# Patient Record
Sex: Female | Born: 1950 | Race: White | Hispanic: No | Smoking: Never smoker
Health system: Southern US, Community
[De-identification: ages and names within clinical notes are randomized; demographics above are authoritative.]

## PROBLEM LIST (undated history)

## (undated) DIAGNOSIS — R7303 Prediabetes: Secondary | ICD-10-CM

## (undated) DIAGNOSIS — E079 Disorder of thyroid, unspecified: Secondary | ICD-10-CM

## (undated) DIAGNOSIS — I10 Essential (primary) hypertension: Secondary | ICD-10-CM

## (undated) DIAGNOSIS — M1990 Unspecified osteoarthritis, unspecified site: Secondary | ICD-10-CM

## (undated) DIAGNOSIS — E785 Hyperlipidemia, unspecified: Secondary | ICD-10-CM

## (undated) DIAGNOSIS — F419 Anxiety disorder, unspecified: Secondary | ICD-10-CM

## (undated) DIAGNOSIS — H269 Unspecified cataract: Secondary | ICD-10-CM

## (undated) DIAGNOSIS — T7840XA Allergy, unspecified, initial encounter: Secondary | ICD-10-CM

## (undated) DIAGNOSIS — G4733 Obstructive sleep apnea (adult) (pediatric): Secondary | ICD-10-CM

## (undated) DIAGNOSIS — K219 Gastro-esophageal reflux disease without esophagitis: Secondary | ICD-10-CM

## (undated) DIAGNOSIS — G4730 Sleep apnea, unspecified: Secondary | ICD-10-CM

## (undated) DIAGNOSIS — K279 Peptic ulcer, site unspecified, unspecified as acute or chronic, without hemorrhage or perforation: Secondary | ICD-10-CM

## (undated) DIAGNOSIS — H409 Unspecified glaucoma: Secondary | ICD-10-CM

## (undated) HISTORY — DX: Allergy, unspecified, initial encounter: T78.40XA

## (undated) HISTORY — PX: POLYPECTOMY: SHX149

## (undated) HISTORY — DX: Unspecified cataract: H26.9

## (undated) HISTORY — DX: Hyperlipidemia, unspecified: E78.5

## (undated) HISTORY — DX: Sleep apnea, unspecified: G47.30

## (undated) HISTORY — DX: Peptic ulcer, site unspecified, unspecified as acute or chronic, without hemorrhage or perforation: K27.9

## (undated) HISTORY — PX: HEMORRHOID BANDING: SHX5850

## (undated) HISTORY — DX: Prediabetes: R73.03

## (undated) HISTORY — DX: Essential (primary) hypertension: I10

## (undated) HISTORY — PX: DILATION AND CURETTAGE OF UTERUS: SHX78

## (undated) HISTORY — DX: Obstructive sleep apnea (adult) (pediatric): G47.33

## (undated) HISTORY — PX: COLONOSCOPY: SHX174

## (undated) HISTORY — PX: BREAST EXCISIONAL BIOPSY: SUR124

## (undated) HISTORY — DX: Unspecified glaucoma: H40.9

## (undated) HISTORY — DX: Unspecified osteoarthritis, unspecified site: M19.90

## (undated) HISTORY — DX: Anxiety disorder, unspecified: F41.9

---

## 1984-02-24 HISTORY — PX: BREAST BIOPSY: SHX20

## 1997-06-06 ENCOUNTER — Other Ambulatory Visit: Admission: RE | Admit: 1997-06-06 | Discharge: 1997-06-06 | Payer: Self-pay | Admitting: *Deleted

## 1997-06-28 ENCOUNTER — Ambulatory Visit: Admission: RE | Admit: 1997-06-28 | Discharge: 1997-06-28 | Payer: Self-pay | Admitting: *Deleted

## 1998-06-13 ENCOUNTER — Other Ambulatory Visit: Admission: RE | Admit: 1998-06-13 | Discharge: 1998-06-13 | Payer: Self-pay | Admitting: *Deleted

## 1998-07-04 ENCOUNTER — Ambulatory Visit (HOSPITAL_COMMUNITY): Admission: RE | Admit: 1998-07-04 | Discharge: 1998-07-04 | Payer: Self-pay | Admitting: *Deleted

## 1998-07-04 ENCOUNTER — Encounter: Payer: Self-pay | Admitting: *Deleted

## 1999-10-13 ENCOUNTER — Encounter: Payer: Self-pay | Admitting: Emergency Medicine

## 1999-10-13 ENCOUNTER — Emergency Department (HOSPITAL_COMMUNITY): Admission: EM | Admit: 1999-10-13 | Discharge: 1999-10-13 | Payer: Self-pay

## 1999-12-26 ENCOUNTER — Other Ambulatory Visit: Admission: RE | Admit: 1999-12-26 | Discharge: 1999-12-26 | Payer: Self-pay | Admitting: Family Medicine

## 2000-01-08 ENCOUNTER — Encounter: Payer: Self-pay | Admitting: Family Medicine

## 2000-01-08 ENCOUNTER — Ambulatory Visit (HOSPITAL_COMMUNITY): Admission: RE | Admit: 2000-01-08 | Discharge: 2000-01-08 | Payer: Self-pay | Admitting: Family Medicine

## 2001-01-10 ENCOUNTER — Encounter: Payer: Self-pay | Admitting: Family Medicine

## 2001-01-10 ENCOUNTER — Ambulatory Visit (HOSPITAL_COMMUNITY): Admission: RE | Admit: 2001-01-10 | Discharge: 2001-01-10 | Payer: Self-pay | Admitting: Family Medicine

## 2001-02-19 ENCOUNTER — Encounter: Payer: Self-pay | Admitting: *Deleted

## 2001-02-19 ENCOUNTER — Ambulatory Visit (HOSPITAL_COMMUNITY): Admission: RE | Admit: 2001-02-19 | Discharge: 2001-02-19 | Payer: Self-pay | Admitting: *Deleted

## 2001-05-23 ENCOUNTER — Other Ambulatory Visit: Admission: RE | Admit: 2001-05-23 | Discharge: 2001-05-23 | Payer: Self-pay | Admitting: Family Medicine

## 2003-03-28 ENCOUNTER — Ambulatory Visit (HOSPITAL_COMMUNITY): Admission: RE | Admit: 2003-03-28 | Discharge: 2003-03-28 | Payer: Self-pay | Admitting: Family Medicine

## 2003-04-26 ENCOUNTER — Other Ambulatory Visit: Admission: RE | Admit: 2003-04-26 | Discharge: 2003-04-26 | Payer: Self-pay | Admitting: Family Medicine

## 2004-01-02 ENCOUNTER — Ambulatory Visit (HOSPITAL_COMMUNITY): Admission: RE | Admit: 2004-01-02 | Discharge: 2004-01-02 | Payer: Self-pay | Admitting: Obstetrics & Gynecology

## 2004-01-02 ENCOUNTER — Encounter (INDEPENDENT_AMBULATORY_CARE_PROVIDER_SITE_OTHER): Payer: Self-pay | Admitting: Specialist

## 2004-04-04 ENCOUNTER — Ambulatory Visit (HOSPITAL_COMMUNITY): Admission: RE | Admit: 2004-04-04 | Discharge: 2004-04-04 | Payer: Self-pay | Admitting: Family Medicine

## 2005-01-28 ENCOUNTER — Emergency Department (HOSPITAL_COMMUNITY): Admission: EM | Admit: 2005-01-28 | Discharge: 2005-01-28 | Payer: Self-pay | Admitting: Emergency Medicine

## 2005-06-12 ENCOUNTER — Ambulatory Visit (HOSPITAL_COMMUNITY): Admission: RE | Admit: 2005-06-12 | Discharge: 2005-06-12 | Payer: Self-pay | Admitting: Family Medicine

## 2006-01-28 ENCOUNTER — Other Ambulatory Visit: Admission: RE | Admit: 2006-01-28 | Discharge: 2006-01-28 | Payer: Self-pay | Admitting: Family Medicine

## 2006-06-17 ENCOUNTER — Ambulatory Visit (HOSPITAL_COMMUNITY): Admission: RE | Admit: 2006-06-17 | Discharge: 2006-06-17 | Payer: Self-pay | Admitting: Family Medicine

## 2007-07-05 ENCOUNTER — Ambulatory Visit (HOSPITAL_COMMUNITY): Admission: RE | Admit: 2007-07-05 | Discharge: 2007-07-05 | Payer: Self-pay | Admitting: Obstetrics & Gynecology

## 2008-07-20 ENCOUNTER — Encounter: Admission: RE | Admit: 2008-07-20 | Discharge: 2008-07-20 | Payer: Self-pay | Admitting: Family Medicine

## 2008-08-09 ENCOUNTER — Encounter: Admission: RE | Admit: 2008-08-09 | Discharge: 2008-08-09 | Payer: Self-pay | Admitting: Family Medicine

## 2008-08-13 ENCOUNTER — Ambulatory Visit (HOSPITAL_COMMUNITY): Admission: RE | Admit: 2008-08-13 | Discharge: 2008-08-13 | Payer: Self-pay | Admitting: Family Medicine

## 2008-08-17 ENCOUNTER — Encounter: Admission: RE | Admit: 2008-08-17 | Discharge: 2008-08-17 | Payer: Self-pay | Admitting: Family Medicine

## 2008-10-12 ENCOUNTER — Encounter (INDEPENDENT_AMBULATORY_CARE_PROVIDER_SITE_OTHER): Payer: Self-pay | Admitting: Obstetrics and Gynecology

## 2008-10-12 ENCOUNTER — Ambulatory Visit (HOSPITAL_COMMUNITY): Admission: RE | Admit: 2008-10-12 | Discharge: 2008-10-12 | Payer: Self-pay | Admitting: Obstetrics and Gynecology

## 2009-08-16 ENCOUNTER — Ambulatory Visit (HOSPITAL_COMMUNITY): Admission: RE | Admit: 2009-08-16 | Discharge: 2009-08-16 | Payer: Self-pay | Admitting: Family Medicine

## 2010-05-31 LAB — BASIC METABOLIC PANEL
CO2: 27 mEq/L (ref 19–32)
Calcium: 9.3 mg/dL (ref 8.4–10.5)
Chloride: 105 mEq/L (ref 96–112)
GFR calc Af Amer: >60 mL/min (ref >60)
Sodium: 140 mEq/L (ref 135–145)

## 2010-05-31 LAB — CBC
Hemoglobin: 14.4 g/dL (ref 12.0–15.0)
MCHC: 34.1 g/dL (ref 30.0–36.0)
RBC: 4.57 MIL/uL (ref 3.87–5.11)
WBC: 6.2 10*3/uL (ref 4.0–10.5)

## 2010-07-08 NOTE — Op Note (Signed)
NAMECLARY, BOULAIS NO.:  [id number]   MEDICAL RECORD NO.:  [id number]          PATIENT TYPE:  AMB   LOCATION:  SDC                           FACILITY:  [facility]   PHYSICIAN:  Miguel Aschoff, M.D.       DATE OF BIRTH:  April 16, 1950   DATE OF PROCEDURE:  10/12/2008  DATE OF DISCHARGE:                               OPERATIVE REPORT   PREOPERATIVE DIAGNOSES:  Postmenopausal bleeding and endometrial polyp.   POSTOPERATIVE DIAGNOSES:  Postmenopausal bleeding and endometrial polyp.   PROCEDURES:  Cervical dilatation, hysteroscopy, removal of endometrial  polyp, and uterine curettage.   SURGEON:  Miguel Aschoff, MD   ANESTHESIA:  General.   COMPLICATIONS:  None.   JUSTIFICATION:  The patient is a 61 year old white female, who had  postmenopausal bleeding and on vaginal ultrasound was noted to have a  polypoid mass within the fundus of the uterus.  Because of this bleeding  and the endometrial polyp, she presents now to undergo hysteroscopy, D  and C, and polypectomy to ensure that a endometrial neoplasm does not  exist.  The risks and benefits of procedure were discussed with the  patient.  Informed consent has been obtained.   PROCEDURE:  The patient was taken to the operating room, placed in  supine position.  General anesthesia was then administered without  difficulty.  She was then placed in dorsal lithotomy position, prepped  and draped in the usual sterile fashion.  Bladder was catheterized.  Examination under anesthesia revealed normal external genitalia, normal  Bartholin and Skene glands, normal urethra.  The vaginal vault was  without gross lesion.  The cervix was without gross lesion.  Uterus was  noted to be normal size and shape.  No adnexal masses were noted.  At  this point, a speculum was placed in the vaginal vault.  Anterior  cervical lip was grasped with a tenaculum and dilated using serial Pratt  dilators until a #25 Pratt dilator could be passed.  At  this point, the  diagnostic hysteroscope was advanced through the endocervical canal.  No  endocervical lesions were noted and on entering the endometrial cavity,  large polypoid mass was noted arising from the right wall.  The fundus  filling two-thirds of the endometrial cavity and no other lesions were  noted.  At this point, hysteroscope was removed.  Polyp forceps were  introduced and polyp was removed with polyp forceps and sent for  histologic study.  The confirmation of polypectomy was then carried out  by replacing the hysteroscope to ensure that it had been totally  removed.  Once this was confirmed, sharp curettage of the uterine cavity  was carried out and this was sent as a separate specimen.  On completion  of the curettage, the procedure was completed.  The cervix was injected  with 20 mL of 1% Xylocaine by placing equal amounts at the 12, 4, and 8  o'clock positions on the cervix.  All instruments were removed.  Hemostasis was readily achieved.  The patient was brought to recovery  room in satisfactory condition.  Estimated blood  loss was 20 mL.   Plan is for the patient to be discharged home.  Medications for home  include Darvocet-N 100 every 4 hours needed for pain, doxycycline 1  twice a day x3 days.  The patient will be seen back in 4 weeks for  followup examination.  She is to call for any problems such as fever,  pain, or heavy bleeding.  She is to call for pathology report in 1 week.      Miguel Aschoff, M.D.  Electronically Signed     AR/MEDQ  D:  10/12/2008  T:  10/12/2008  Job:  [id number]

## 2010-07-11 NOTE — H&P (Signed)
Beth Sawyer, TABAK              ACCOUNT NO.:  [id number]   MEDICAL RECORD NO.:  [id number]          PATIENT TYPE:  AMB   LOCATION:  SDC                           [facility]   PHYSICIAN:  Roseanna Rainbow, M.D.DATE OF BIRTH:  1950-12-31   DATE OF ADMISSION:  DATE OF DISCHARGE:                                HISTORY & PHYSICAL   CHIEF COMPLAINT:  The patient is a 60 year old with an endometrial polyp for  D&C hysteroscopy.   HISTORY OF PRESENT ILLNESS:  As above.  The patient reports no menses for 2  years.  Subsequently, she has had several episodes of bleeding.  Work-up to  date has included a pelvic ultrasound on October 25, 2003 that demonstrated  an endometrial stripe of 1.1 cm.  An endometrial biopsy performed in the  office demonstrated a hyperplastic type endometrial polyp with scant  fragments of atrophic type endometrium.  The patient is not currently on any  hormone replacement therapy.  She denies any concomitant symptoms.  She also  has a history of endometrial polyps, and is status post a D&C several years  ago.   PAST MEDICAL HISTORY:  1.  Hypercholesterolemia.  2.  Hypothyroidism.  3.  Hypertension.  4.  Urinary tract infections.  5.  Diverticulosis.  6.  Familial tremor.   PAST SURGICAL HISTORY:  1.  See above.  2.  She has a history of a breast biopsy.   SOCIAL HISTORY:  She is a widow.  She has no significant smoking history.  She denies alcohol usage.  There is no recreational drug use.   FAMILY HISTORY:  Positive for adult onset diabetes, ischemic heart disease,  breast cancer, and cervical cancer.   PAST OBSTETRICAL HISTORY:  She has 3 living children.  She is status post 3  cesarean deliveries.   PAST GYNECOLOGIC HISTORY:  See above.  She is not sexually active.  She has  a history of a normal Pap smear in March of 2005.  Last mammogram was  performed in February of 2005.   REVIEW OF SYSTEMS:  GU:  See history of present illness.  She  reports pain  with intercourse.   MEDICATIONS:  1.  Levoxyl 50 mcg tablets, one tablet p.o. daily.  2.  Nasonex.  3.  Toprol-XL 50 mg p.o. daily.   ALLERGIES:  CODEINE.   PHYSICAL EXAMINATION:  VITAL SIGNS:  Stable, afebrile.  GENERAL:  Well-developed, well-nourished, in no apparent distress.  NECK:  Supple.  LUNGS:  Clear to auscultation bilaterally.  HEART:  Regular rate and rhythm.  ABDOMEN:  Soft, nontender, without masses.  PELVIC:  EGBUS normal.  On speculum exam, the vagina is clean.  The cervix  is without visible lesions.  On bimanual exam, the uterus is mildly  enlarged, anteverted, nontender.  The adnexa are nonpalpable, nontender.   ASSESSMENT AND PLAN:  Postmenopausal bleeding with an endometrial polyp.   PLAN:  The planned procedure is a D&C hysteroscopy, possible operative  hysteroscopy, with removal of an endometrial polyp.  The risks, benefits,  and alternative forms of management were reviewed with  the patient, and  informed consent was obtained.     Collier Flowers  D:  01/01/2004  T:  01/01/2004  Job:  [id number]   cc:   Magnus Sinning. Dimple Casey, M.D.  [address] [place]  [place] [address]  Fax: [phone]

## 2010-07-11 NOTE — Op Note (Signed)
Beth Sawyer, Beth Sawyer              ACCOUNT NO.:  [id number]   MEDICAL RECORD NO.:  [id number]          PATIENT TYPE:  AMB   LOCATION:  SDC                           [facility]   PHYSICIAN:  Roseanna Rainbow, M.D.DATE OF BIRTH:  Oct 05, 1950   DATE OF PROCEDURE:  01/02/2004  DATE OF DISCHARGE:                                 OPERATIVE REPORT   PREOPERATIVE DIAGNOSES:  Postmenopausal bleeding, rule out endometrial  polyp.   POSTOPERATIVE DIAGNOSES:  Postmenopausal bleeding, rule out endometrial  polyp.   PROCEDURE:  Diagnostic hysteroscopy with dilatation and curettage and  removal of endometrial polyps.   SURGEON:  Roseanna Rainbow, M.D.   ANESTHESIA:  General endotracheal.   COMPLICATIONS:  None.   ESTIMATED BLOOD LOSS:  Less than 50 mL.   FINDINGS:  There were 2 polypoid structures less than 1 cm in diameter  arising from the posterior wall of the uterine fundus.  The remainder of the  endometrial lining appeared atrophic.   DESCRIPTION OF PROCEDURE:  The patient was taken to the operating room.  A  weighted speculum was placed in the patient's vagina, the cervix and vagina  were swabbed with Betadine. The single tooth tenaculum was applied to the  anterior lip of the cervix.  The cervix was then dilated with [facility]  dilators. A diagnostic hysteroscopy was then performed using glycine as a  distending medium. The above findings were noted. The hysteroscope was then  removed. A sharp curettage was performed. Polypoid structures were then  removed using polyp forceps. The hysteroscope was then reintroduced into the  uterus and findings were consistent with removal of the polyps.  The single  tooth tenaculum was removed and there was minimal bleeding noted from the  cervix. The patient tolerated the procedure well. The patient was taken to  the PACU in stable condition.   PATHOLOGY:  Endometrial curettings and endometrial polyps.     Collier Flowers  D:   01/02/2004  T:  01/02/2004  Job:  [id number]

## 2010-07-19 ENCOUNTER — Encounter: Payer: Self-pay | Admitting: Gastroenterology

## 2010-07-29 ENCOUNTER — Other Ambulatory Visit (HOSPITAL_COMMUNITY): Payer: Self-pay | Admitting: Nurse Practitioner

## 2010-09-04 ENCOUNTER — Other Ambulatory Visit (HOSPITAL_COMMUNITY): Payer: Self-pay | Admitting: Bariatrics

## 2010-09-04 DIAGNOSIS — Z1231 Encounter for screening mammogram for malignant neoplasm of breast: Secondary | ICD-10-CM

## 2010-09-18 ENCOUNTER — Ambulatory Visit (HOSPITAL_COMMUNITY): Payer: Self-pay

## 2010-09-24 ENCOUNTER — Ambulatory Visit (HOSPITAL_COMMUNITY)
Admission: RE | Admit: 2010-09-24 | Discharge: 2010-09-24 | Disposition: A | Discharge status: Home / Self Care | Payer: Self-pay | Source: Ambulatory Visit | Attending: Bariatrics | Admitting: Bariatrics

## 2010-09-24 DIAGNOSIS — Z1231 Encounter for screening mammogram for malignant neoplasm of breast: Secondary | ICD-10-CM

## 2010-09-26 ENCOUNTER — Other Ambulatory Visit: Payer: Self-pay | Admitting: Bariatrics

## 2010-09-26 ENCOUNTER — Other Ambulatory Visit: Payer: Self-pay | Admitting: Unknown Physician Specialty

## 2010-09-26 DIAGNOSIS — R928 Other abnormal and inconclusive findings on diagnostic imaging of breast: Secondary | ICD-10-CM

## 2010-10-03 ENCOUNTER — Ambulatory Visit
Admission: RE | Admit: 2010-10-03 | Discharge: 2010-10-03 | Disposition: A | Discharge status: Home / Self Care | Payer: Self-pay | Source: Ambulatory Visit | Attending: Bariatrics | Admitting: Bariatrics

## 2010-10-03 DIAGNOSIS — R928 Other abnormal and inconclusive findings on diagnostic imaging of breast: Secondary | ICD-10-CM

## 2011-04-16 ENCOUNTER — Emergency Department (HOSPITAL_COMMUNITY): Payer: Self-pay

## 2011-04-16 ENCOUNTER — Other Ambulatory Visit: Payer: Self-pay

## 2011-04-16 ENCOUNTER — Emergency Department (HOSPITAL_COMMUNITY)
Admission: EM | Admit: 2011-04-16 | Discharge: 2011-04-16 | Disposition: A | Discharge status: Home / Self Care | Payer: Self-pay | Attending: Emergency Medicine | Admitting: Emergency Medicine

## 2011-04-16 ENCOUNTER — Encounter (HOSPITAL_COMMUNITY): Payer: Self-pay

## 2011-04-16 DIAGNOSIS — E079 Disorder of thyroid, unspecified: Secondary | ICD-10-CM | POA: Insufficient documentation

## 2011-04-16 DIAGNOSIS — R071 Chest pain on breathing: Secondary | ICD-10-CM | POA: Insufficient documentation

## 2011-04-16 DIAGNOSIS — R079 Chest pain, unspecified: Secondary | ICD-10-CM | POA: Insufficient documentation

## 2011-04-16 DIAGNOSIS — Z79899 Other long term (current) drug therapy: Secondary | ICD-10-CM | POA: Insufficient documentation

## 2011-04-16 DIAGNOSIS — R0789 Other chest pain: Secondary | ICD-10-CM

## 2011-04-16 DIAGNOSIS — I10 Essential (primary) hypertension: Secondary | ICD-10-CM | POA: Insufficient documentation

## 2011-04-16 HISTORY — DX: Essential (primary) hypertension: I10

## 2011-04-16 HISTORY — DX: Disorder of thyroid, unspecified: E07.9

## 2011-04-16 LAB — POCT I-STAT TROPONIN I: Troponin i, poc: 0.04 ng/mL (ref 0.00–0.08)

## 2011-04-16 LAB — BASIC METABOLIC PANEL
CO2: 27 mEq/L (ref 19–32)
Calcium: 10.1 mg/dL (ref 8.4–10.5)
GFR calc non Af Amer: >90 mL/min (ref >90)
Sodium: 142 mEq/L (ref 135–145)

## 2011-04-16 LAB — DIFFERENTIAL
Basophils Absolute: 0 10*3/uL (ref 0.0–0.1)
Eosinophils Absolute: 1.2 10*3/uL — ABNORMAL HIGH (ref 0.0–0.7)
Eosinophils Relative: 15 % — ABNORMAL HIGH (ref 0–5)
Lymphocytes Relative: 27 % (ref 12–46)

## 2011-04-16 LAB — CBC
MCV: 91.6 fL (ref 78.0–100.0)
Platelets: 307 10*3/uL (ref 150–400)
RDW: 12.8 % (ref 11.5–15.5)
WBC: 8.3 10*3/uL (ref 4.0–10.5)

## 2011-04-16 LAB — D-DIMER, QUANTITATIVE: D-Dimer, Quant: 0.52 ug/mL-FEU — ABNORMAL HIGH (ref 0.00–0.48)

## 2011-04-16 MED ORDER — ASPIRIN 81 MG PO CHEW
324.0000 mg | CHEWABLE_TABLET | Freq: Once | ORAL | Status: AC
  Administered 2011-04-16: 324 mg via ORAL
  Filled 2011-04-16: qty 4

## 2011-04-16 MED ORDER — IOHEXOL 350 MG/ML SOLN
100.0000 mL | Freq: Once | INTRAVENOUS | Status: AC | PRN
  Administered 2011-04-16: 100 mL via INTRAVENOUS

## 2011-04-16 NOTE — ED Notes (Signed)
Awaiting ct angio  

## 2011-04-16 NOTE — ED Provider Notes (Signed)
Assumed care from [facility].  Patient with difficult IV access.  18G angio to L forearm placed by resident Dr. Lendell Caprice. CTPE neg.  F'u with Dr. Garner Nash for atypical chest pain.  Angiocath insertion Performed by: Glynn Octave  Consent: Verbal consent obtained. Risks and benefits: risks, benefits and alternatives were discussed Time out: Immediately prior to procedure a "time out" was called to verify the correct patient, procedure, equipment, support staff and site/side marked as required.  Preparation: Patient was prepped and draped in the usual sterile fashion.  Vein Location: L AC  Yes Ultrasound Guided  Gauge: 18  Normal blood return and flush without difficulty Patient tolerance: Patient tolerated the procedure well with no immediate complications.     Glynn Octave, MD 04/16/11 [phone]

## 2011-04-16 NOTE — ED Notes (Signed)
Returned from ct 

## 2011-04-16 NOTE — ED Provider Notes (Signed)
History     CSN: [id number]  Arrival date & time 04/16/11  1223   First MD Initiated Contact with Patient 04/16/11 1424      Chief Complaint  Patient presents with  . Chest Pain    right sided    (Consider location/radiation/quality/duration/timing/severity/associated sxs/prior treatment) HPI  Patient presents to emergency department complaining of a two-week history of right-sided chest discomfort that is episodic throughout the day every day for 2 weeks. Patient states when she has the discomfort in the right side of her chest it feels like acute onset of sharp pain in chest that will quickly turn "sore" with a radiation of "heaviness into her right arm." patient states pain lasts a few minutes. Patient has taken no medication prior to arrival for the symptoms. She denies daily aspirin use. She states she has high blood pressure and is hypothyroid for which she takes medication. Patient states that approximately 2 weeks ago she had temporarily stopped her Prevacid but when she began to have the discomfort in her chest began to Prevacid once again. Denies any other changes in her daily medication use. Patient denies tobacco use, alcohol use, or illicit drug use. Patient states pain is mildly aggravated by swelling her chest, deep inhalation, but states that if she bends forward then she knows she "will get sharp pain into chest." She denies alleviating factors. She denies associated fevers, chills, shortness of breath, diaphoresis, nausea, vomiting, abdominal pain, cough, hemoptysis, lower extremity pain or swelling, history of DVT or PE, or exogenous estrogen use.  Past Medical History  Diagnosis Date  . Thyroid disease   . Hypertension     History reviewed. No pertinent past surgical history.  History reviewed. No pertinent family history.  History  Substance Use Topics  . Smoking status: Never Smoker   . Smokeless tobacco: Not on file  . Alcohol Use: No    OB History    Grav  Para Term Preterm Abortions TAB SAB Ect Mult Living                  Review of Systems  All other systems reviewed and are negative.    Allergies  Codeine  Home Medications   Current Outpatient Rx  Name Route Sig Dispense Refill  . OMEGA-3 FATTY ACIDS 1000 MG PO CAPS Oral Take 1 g by mouth daily.    Marland Kitchen LANSOPRAZOLE 15 MG PO CPDR Oral Take 15 mg by mouth daily.    Marland Kitchen LEVOTHYROXINE SODIUM 75 MCG PO TABS Oral Take 75 mcg by mouth daily.    Marland Kitchen METOPROLOL SUCCINATE ER 50 MG PO TB24 Oral Take 50 mg by mouth daily. Take with or immediately following a meal.    . ADULT MULTIVITAMIN W/MINERALS CH Oral Take 1 tablet by mouth daily.    Marland Kitchen TIMOLOL MALEATE 0.5 % OP SOLN Both Eyes Place 1 drop into both eyes 2 (two) times daily.      BP 152/78  Pulse 81  Temp(Src) 98.2 F (36.8 C) (Oral)  Resp 20  SpO2 97%  Physical Exam  Nursing note and vitals reviewed. Constitutional: She is oriented to person, place, and time. She appears well-developed and well-nourished. No distress.  HENT:  Head: Normocephalic and atraumatic.  Eyes: Conjunctivae are normal. Pupils are equal, round, and reactive to light.  Neck: Normal range of motion. Neck supple. No thyromegaly present.  Cardiovascular: Normal rate, regular rhythm, normal heart sounds and intact distal pulses.  Exam reveals no gallop and no  friction rub.   No murmur heard. Pulmonary/Chest: Effort normal and breath sounds normal. No respiratory distress. She has no wheezes. She has no rales. She exhibits tenderness.       Mild tenderness to palpation of right anterior chest wall but without skin changes, rash, or crepitus.   Abdominal: Bowel sounds are normal. She exhibits no distension and no mass. There is no tenderness. There is no rebound and no guarding.  Musculoskeletal: Normal range of motion. She exhibits no edema and no tenderness.       Full range of motion of bilateral upper extremities without pain or difficulty.  Neurological: She is  alert and oriented to person, place, and time.  Skin: Skin is warm and dry. No rash noted. She is not diaphoretic. No erythema.  Psychiatric: She has a normal mood and affect.    ED Course  Procedures (including critical care time)  By mouth aspirin   Date: 04/16/2011  Rate: 76  Rhythm: normal sinus rhythm  QRS Axis: normal  Intervals: normal  ST/T Wave abnormalities: normal, non specific t wave changes  Conduction Disutrbances: none  Narrative Interpretation: non provocative  Old EKG Reviewed: no prior EKG      Labs Reviewed  CBC  DIFFERENTIAL  BASIC METABOLIC PANEL  D-DIMER, QUANTITATIVE   Dg Chest 2 View  04/16/2011  *RADIOLOGY REPORT*  Clinical Data: Right chest pain  CHEST - 2 VIEW  Comparison: None.  Findings: The cardiac silhouette, mediastinum, pulmonary vasculature are within normal limits.  Both lungs are clear. There is no acute bony abnormality.  IMPRESSION: There is no evidence of acute cardiac or pulmonary process.  Original Report Authenticated By: Brandon Melnick, M.D.     No diagnosis found.    MDM  Dr. Manus Gunning to follow patient with BMET and CT angio chest pending to r/o PE with mild elevation in ddimer. VSS.         Jenness Corner, [place] 04/16/11 1544

## 2011-04-16 NOTE — ED Notes (Signed)
Pt states that 2 weeks ago she had onset of right sided chest pain with some radiation into her right arm. She denies any other s/s. Pt denies any trauma or falls. No meds pta. Alert and oriented.

## 2011-04-16 NOTE — ED Notes (Signed)
2 RNs attempted IV access, unsuccessful; paged IV team

## 2011-04-16 NOTE — ED Notes (Signed)
Ed resident at bedside to place IV via ultrasound.

## 2011-04-16 NOTE — ED Provider Notes (Signed)
Patient states over the last couple weeks in 2-3 times a day she was a sudden onset of sharp stabbing right-sided chest discomfort that gradually goes away it resolves in several minutes which is nonexertional and nonpleuritic but is somewhat positional because it recurs if she bends over he leans forward to tie her shoes.  Medical screening examination/treatment/procedure(s) were conducted as a shared visit with non-physician practitioner(s) and myself.  I personally evaluated the patient during the encounter  Hurman Horn, MD 04/16/11 1902

## 2011-04-16 NOTE — Discharge Instructions (Signed)
There is no evidence of a heart attack or blood clot in her lung. Followup with Dr. Garner Nash for a stress test. Return to the ED if there are worsening symptoms.  Chest Pain (Nonspecific) It is often hard to give a specific diagnosis for the cause of chest pain. There is always a chance that your pain could be related to something serious, such as a heart attack or a blood clot in the lungs. You need to follow up with your caregiver for further evaluation. CAUSES   Heartburn.   Pneumonia or bronchitis.   Anxiety and stress.   Inflammation around your heart (pericarditis) or lung (pleuritis or pleurisy).   A blood clot in the lung.   A collapsed lung (pneumothorax). It can develop suddenly on its own (spontaneous pneumothorax) or from injury (trauma) to the chest.  The chest wall is composed of bones, muscles, and cartilage. Any of these can be the source of the pain.  The bones can be bruised by injury.   The muscles or cartilage can be strained by coughing or overwork.   The cartilage can be affected by inflammation and become sore (costochondritis).  DIAGNOSIS  Lab tests or other studies, such as X-rays, an EKG, stress testing, or cardiac imaging, may be needed to find the cause of your pain.  TREATMENT   Treatment depends on what may be causing your chest pain. Treatment may include:   Acid blockers for heartburn.   Anti-inflammatory medicine.   Pain medicine for inflammatory conditions.   Antibiotics if an infection is present.   You may be advised to change lifestyle habits. This includes stopping smoking and avoiding caffeine and chocolate.   You may be advised to keep your head raised (elevated) when sleeping. This reduces the chance of acid going backward from your stomach into your esophagus.   Most of the time, nonspecific chest pain will improve within 2 to 3 days with rest and mild pain medicine.  HOME CARE INSTRUCTIONS   If antibiotics were prescribed, take  the full amount even if you start to feel better.   For the next few days, avoid physical activities that bring on chest pain. Continue physical activities as directed.   Do not smoke cigarettes or drink alcohol until your symptoms are gone.   Only take over-the-counter or prescription medicine for pain, discomfort, or fever as directed by your caregiver.   Follow your caregiver's suggestions for further testing if your chest pain does not go away.   Keep any follow-up appointments you made. If you do not go to an appointment, you could develop lasting (chronic) problems with pain. If there is any problem keeping an appointment, you must call to reschedule.  SEEK MEDICAL CARE IF:   You think you are having problems from the medicine you are taking. Read your medicine instructions carefully.   Your chest pain does not go away, even after treatment.   You develop a rash with blisters on your chest.  SEEK IMMEDIATE MEDICAL CARE IF:   You have increased chest pain or pain that spreads to your arm, neck, jaw, back, or belly (abdomen).   You develop shortness of breath, an increasing cough, or you are coughing up blood.   You have severe back or abdominal pain, feel sick to your stomach (nauseous) or throw up (vomit).   You develop severe weakness, fainting, or chills.   You have an oral temperature above 102 F (38.9 C), not controlled by medicine.  THIS  IS AN EMERGENCY. Do not wait to see if the pain will go away. Get medical help at once. Call your local emergency services (911 in [place] Do not drive yourself to the hospital. MAKE SURE YOU:   Understand these instructions.   Will watch your condition.   Will get help right away if you are not doing well or get worse.  Document Released: 11/19/2004 Document Revised: 10/22/2010 Document Reviewed: 09/15/2007 [facility] Patient Information 2012 [place], [place].Chest Pain (Nonspecific) It is often hard to give a specific diagnosis for  the cause of chest pain. There is always a chance that your pain could be related to something serious, such as a heart attack or a blood clot in the lungs. You need to follow up with your caregiver for further evaluation. CAUSES   Heartburn.   Pneumonia or bronchitis.   Anxiety and stress.   Inflammation around your heart (pericarditis) or lung (pleuritis or pleurisy).   A blood clot in the lung.   A collapsed lung (pneumothorax). It can develop suddenly on its own (spontaneous pneumothorax) or from injury (trauma) to the chest.  The chest wall is composed of bones, muscles, and cartilage. Any of these can be the source of the pain.  The bones can be bruised by injury.   The muscles or cartilage can be strained by coughing or overwork.   The cartilage can be affected by inflammation and become sore (costochondritis).  DIAGNOSIS  Lab tests or other studies, such as X-rays, an EKG, stress testing, or cardiac imaging, may be needed to find the cause of your pain.  TREATMENT   Treatment depends on what may be causing your chest pain. Treatment may include:   Acid blockers for heartburn.   Anti-inflammatory medicine.   Pain medicine for inflammatory conditions.   Antibiotics if an infection is present.   You may be advised to change lifestyle habits. This includes stopping smoking and avoiding caffeine and chocolate.   You may be advised to keep your head raised (elevated) when sleeping. This reduces the chance of acid going backward from your stomach into your esophagus.   Most of the time, nonspecific chest pain will improve within 2 to 3 days with rest and mild pain medicine.  HOME CARE INSTRUCTIONS   If antibiotics were prescribed, take the full amount even if you start to feel better.   For the next few days, avoid physical activities that bring on chest pain. Continue physical activities as directed.   Do not smoke cigarettes or drink alcohol until your symptoms are  gone.   Only take over-the-counter or prescription medicine for pain, discomfort, or fever as directed by your caregiver.   Follow your caregiver's suggestions for further testing if your chest pain does not go away.   Keep any follow-up appointments you made. If you do not go to an appointment, you could develop lasting (chronic) problems with pain. If there is any problem keeping an appointment, you must call to reschedule.  SEEK MEDICAL CARE IF:   You think you are having problems from the medicine you are taking. Read your medicine instructions carefully.   Your chest pain does not go away, even after treatment.   You develop a rash with blisters on your chest.  SEEK IMMEDIATE MEDICAL CARE IF:   You have increased chest pain or pain that spreads to your arm, neck, jaw, back, or belly (abdomen).   You develop shortness of breath, an increasing cough, or you are coughing  up blood.   You have severe back or abdominal pain, feel sick to your stomach (nauseous) or throw up (vomit).   You develop severe weakness, fainting, or chills.   You have an oral temperature above 102 F (38.9 C), not controlled by medicine.  THIS IS AN EMERGENCY. Do not wait to see if the pain will go away. Get medical help at once. Call your local emergency services (911 in [place] Do not drive yourself to the hospital. MAKE SURE YOU:   Understand these instructions.   Will watch your condition.   Will get help right away if you are not doing well or get worse.  Document Released: 11/19/2004 Document Revised: 10/22/2010 Document Reviewed: 09/15/2007 [facility] Patient Information 2012 [place], [place].

## 2011-04-16 NOTE — ED Notes (Signed)
Pt to ED for eval of CP X 2 weeks intermittently; pt reports that she went off her prevacid and that is when the pain started; pt reports that she restarted it 11 days ago, and thought that pain would go away but it has not; pain started to get worse today; pt describes it as pressure; pt reports that pain will radiate from R chest down R arm, and at times across to L chest; pt denies sob; pt does state that pain gets worse with deep breath; pt denies recent travel

## 2011-04-16 NOTE — ED Notes (Signed)
Patient transported to CT 

## 2011-05-07 ENCOUNTER — Other Ambulatory Visit (HOSPITAL_COMMUNITY): Payer: Self-pay | Admitting: Cardiology

## 2011-05-12 ENCOUNTER — Encounter (HOSPITAL_COMMUNITY)
Admission: RE | Admit: 2011-05-12 | Discharge: 2011-05-12 | Disposition: A | Discharge status: Home / Self Care | Payer: Self-pay | Source: Ambulatory Visit | Attending: Cardiology | Admitting: Cardiology

## 2011-05-12 ENCOUNTER — Ambulatory Visit (HOSPITAL_COMMUNITY)
Admission: RE | Admit: 2011-05-12 | Discharge: 2011-05-12 | Disposition: A | Discharge status: Home / Self Care | Payer: Self-pay | Source: Ambulatory Visit | Attending: Cardiology | Admitting: Cardiology

## 2011-05-12 DIAGNOSIS — I059 Rheumatic mitral valve disease, unspecified: Secondary | ICD-10-CM | POA: Insufficient documentation

## 2011-05-12 DIAGNOSIS — R0989 Other specified symptoms and signs involving the circulatory and respiratory systems: Secondary | ICD-10-CM | POA: Insufficient documentation

## 2011-05-12 DIAGNOSIS — R079 Chest pain, unspecified: Secondary | ICD-10-CM | POA: Insufficient documentation

## 2011-05-12 DIAGNOSIS — E039 Hypothyroidism, unspecified: Secondary | ICD-10-CM | POA: Insufficient documentation

## 2011-05-12 DIAGNOSIS — Z8249 Family history of ischemic heart disease and other diseases of the circulatory system: Secondary | ICD-10-CM | POA: Insufficient documentation

## 2011-05-12 DIAGNOSIS — I10 Essential (primary) hypertension: Secondary | ICD-10-CM | POA: Insufficient documentation

## 2011-05-12 DIAGNOSIS — R0602 Shortness of breath: Secondary | ICD-10-CM

## 2011-05-12 DIAGNOSIS — R0609 Other forms of dyspnea: Secondary | ICD-10-CM | POA: Insufficient documentation

## 2011-05-12 MED ORDER — TECHNETIUM TC 99M TETROFOSMIN IV KIT
30.0000 | PACK | Freq: Once | INTRAVENOUS | Status: AC | PRN
  Administered 2011-05-12: 30 via INTRAVENOUS

## 2011-05-12 MED ORDER — TECHNETIUM TC 99M TETROFOSMIN IV KIT
10.0000 | PACK | Freq: Once | INTRAVENOUS | Status: AC | PRN
  Administered 2011-05-12: 10 via INTRAVENOUS

## 2011-05-12 NOTE — Progress Notes (Signed)
  Echocardiogram 2D Echocardiogram has been performed.  Taylinn Brabant, Real Cons 05/12/2011, 12:48 PM

## 2011-09-21 ENCOUNTER — Other Ambulatory Visit: Payer: Self-pay | Admitting: Bariatrics

## 2011-10-13 ENCOUNTER — Other Ambulatory Visit (HOSPITAL_COMMUNITY): Payer: Self-pay | Admitting: Nurse Practitioner

## 2011-10-13 DIAGNOSIS — Z139 Encounter for screening, unspecified: Secondary | ICD-10-CM

## 2011-10-19 ENCOUNTER — Ambulatory Visit (HOSPITAL_COMMUNITY)
Admission: RE | Admit: 2011-10-19 | Discharge: 2011-10-19 | Disposition: A | Discharge status: Home / Self Care | Payer: Self-pay | Source: Ambulatory Visit | Attending: Family Medicine | Admitting: Family Medicine

## 2011-10-19 DIAGNOSIS — Z139 Encounter for screening, unspecified: Secondary | ICD-10-CM

## 2012-02-24 HISTORY — PX: CATARACT EXTRACTION: SUR2

## 2012-04-27 ENCOUNTER — Other Ambulatory Visit: Payer: Self-pay | Admitting: Obstetrics and Gynecology

## 2012-04-27 ENCOUNTER — Encounter (HOSPITAL_COMMUNITY): Payer: Self-pay | Admitting: Pharmacist

## 2012-05-02 ENCOUNTER — Encounter (HOSPITAL_COMMUNITY)
Admission: RE | Admit: 2012-05-02 | Discharge: 2012-05-02 | Disposition: A | Discharge status: Home / Self Care | Payer: BC Managed Care – PPO | Source: Ambulatory Visit | Attending: Obstetrics and Gynecology | Admitting: Obstetrics and Gynecology

## 2012-05-02 ENCOUNTER — Encounter (HOSPITAL_COMMUNITY): Payer: Self-pay

## 2012-05-02 HISTORY — DX: Gastro-esophageal reflux disease without esophagitis: K21.9

## 2012-05-02 LAB — CBC
MCH: 30.5 pg (ref 26.0–34.0)
MCHC: 32.7 g/dL (ref 30.0–36.0)
Platelets: 243 10*3/uL (ref 150–400)
RDW: 12.9 % (ref 11.5–15.5)

## 2012-05-02 LAB — BASIC METABOLIC PANEL
Calcium: 9.7 mg/dL (ref 8.4–10.5)
GFR calc non Af Amer: >90 mL/min (ref >90)
Sodium: 139 mEq/L (ref 135–145)

## 2012-05-02 NOTE — Patient Instructions (Addendum)
20 Beth Sawyer  05/02/2012   Your procedure is scheduled on:  05/10/12  Enter through the Main Entrance of [facility] at  [place].  Pick up the phone at the desk and dial [phone].   Call this number if you have problems the morning of surgery: [phone]   Remember:   Do not eat food:After Midnight.  Do not drink clear liquids: After Midnight.  Take these medicines the morning of surgery with A SIP OF WATER: Blood pressure and thyroid medications   Do not wear jewelry, make-up or nail polish.  Do not wear lotions, powders, or perfumes. You may wear deodorant.  Do not shave 48 hours prior to surgery.  Do not bring valuables to the hospital.  Contacts, dentures or bridgework may not be worn into surgery.  Leave suitcase in the car. After surgery it may be brought to your room.  For patients admitted to the hospital, checkout time is 11:00 AM the day of discharge.   Patients discharged the day of surgery will not be allowed to drive home.  Name and phone number of your driver: undecided  Special Instructions: Shower using CHG 2 nights before surgery and the night before surgery.  If you shower the day of surgery use CHG.  Use special wash - you have one bottle of CHG for all showers.  You should use approximately 1/3 of the bottle for each shower.   Please read over the following fact sheets that you were given: Surgical Site Infection Prevention

## 2012-05-04 ENCOUNTER — Other Ambulatory Visit: Payer: Self-pay | Admitting: Obstetrics and Gynecology

## 2012-05-09 NOTE — H&P (Signed)
Beth Sawyer is an 62 y.o. female who presented to the office on April 27, 2012 reporting that she had vaginal bleeding like a menses in December 2013. She had a similar episode of bleeding in 2010 and underwent hysteroscopy and D&C. The findings at that time revealed benign endometrial polyps. Now with renewed bleeding she is being taken to the operating room to undergo repeat D&C and hysteroscopy to rule out and neoplastic process or redevelopment of polyps.  Pertinent Gynecological History: Bleeding: post menopausal bleeding  Last pap: normal Date: 04/27/2012  OB History: G3 P3003   Menstrual History:  No LMP recorded. Patient is postmenopausal.    Past Medical History  Diagnosis Date  . Thyroid disease   . Hypertension   . GERD (gastroesophageal reflux disease)     Past Surgical History  Procedure Laterality Date  . Dilation and curettage of uterus      x2  . Cesarean section      x3  . Breast biopsy      No family history on file.  Social History:  reports that she has never smoked. She does not have any smokeless tobacco history on file. She reports that she does not drink alcohol or use illicit drugs.  Allergies:  Allergies  Allergen Reactions  . Morphine And Related Nausea And Vomiting  . Codeine Nausea And Vomiting    "made me real hot"  . Oxycodone Nausea And Vomiting    No prescriptions prior to admission    Review of Systems  Constitutional: Negative.   Respiratory: Negative for cough and hemoptysis.   Cardiovascular: Negative for chest pain, palpitations and orthopnea.  Gastrointestinal: Negative for heartburn, nausea, vomiting, abdominal pain, diarrhea and constipation.  Genitourinary: Negative for dysuria, urgency and frequency.    There were no vitals taken for this visit. Physical Exam  Constitutional: She is oriented to person, place, and time. She appears well-developed and well-nourished.  HENT:  Head: Normocephalic and atraumatic.   Eyes: Conjunctivae and EOM are normal. Pupils are equal, round, and reactive to light.  Neck: Normal range of motion. Neck supple.  Cardiovascular: Normal rate and regular rhythm.   Respiratory: Effort normal and breath sounds normal.  GI: She exhibits no distension and no mass. There is no tenderness. There is no rebound and no guarding.  Neurological: She is alert and oriented to person, place, and time.  Psychiatric: She has a normal mood and affect. Her behavior is normal.  Gyn Examination   External genitalia: within normal limits   BUS: within normal limits   Vagina: no lesions seen. No blood noted   Cervix: non tender and without lesion   Uterus: anterior normal size shape and position   Adnexa: non masses found and non tender  No results found for this or any previous visit (from the past 24 hour(s)).  No results found.  Impression: Post menopausal bleeding          Plan: D&C and hysteroscopy.  The risks and benefits and rationale for procedure were discussed with the patient.   Felicity Penix 05/09/2012, 3:01 PM

## 2012-05-10 ENCOUNTER — Encounter (HOSPITAL_COMMUNITY)
Admission: RE | Disposition: A | Discharge status: Home / Self Care | Payer: Self-pay | Source: Ambulatory Visit | Attending: Obstetrics and Gynecology

## 2012-05-10 ENCOUNTER — Ambulatory Visit (HOSPITAL_COMMUNITY)
Admission: RE | Admit: 2012-05-10 | Discharge: 2012-05-10 | Disposition: A | Discharge status: Home / Self Care | Payer: BC Managed Care – PPO | Source: Ambulatory Visit | Attending: Obstetrics and Gynecology | Admitting: Obstetrics and Gynecology

## 2012-05-10 ENCOUNTER — Ambulatory Visit (HOSPITAL_COMMUNITY): Payer: BC Managed Care – PPO | Admitting: Anesthesiology

## 2012-05-10 ENCOUNTER — Encounter (HOSPITAL_COMMUNITY): Payer: Self-pay | Admitting: Anesthesiology

## 2012-05-10 DIAGNOSIS — N950 Postmenopausal bleeding: Secondary | ICD-10-CM | POA: Insufficient documentation

## 2012-05-10 DIAGNOSIS — N840 Polyp of corpus uteri: Secondary | ICD-10-CM | POA: Insufficient documentation

## 2012-05-10 HISTORY — PX: HYSTEROSCOPY WITH D & C: SHX1775

## 2012-05-10 SURGERY — DILATATION AND CURETTAGE /HYSTEROSCOPY
Anesthesia: General | Site: Vagina | Wound class: Clean Contaminated

## 2012-05-10 MED ORDER — ONDANSETRON HCL 4 MG/2ML IJ SOLN
INTRAMUSCULAR | Status: AC
  Filled 2012-05-10: qty 2

## 2012-05-10 MED ORDER — FENTANYL CITRATE 0.05 MG/ML IJ SOLN: 25.0000 ug | INTRAMUSCULAR | Status: DC | PRN

## 2012-05-10 MED ORDER — EPHEDRINE 5 MG/ML INJ
INTRAVENOUS | Status: AC
  Filled 2012-05-10: qty 10

## 2012-05-10 MED ORDER — PROPOFOL 10 MG/ML IV EMUL
INTRAVENOUS | Status: AC
  Filled 2012-05-10: qty 20

## 2012-05-10 MED ORDER — LIDOCAINE HCL 1 % IJ SOLN
INTRAMUSCULAR | Status: DC | PRN
  Administered 2012-05-10: 10 mL

## 2012-05-10 MED ORDER — FENTANYL CITRATE 0.05 MG/ML IJ SOLN
INTRAMUSCULAR | Status: AC
  Filled 2012-05-10: qty 2

## 2012-05-10 MED ORDER — MIDAZOLAM HCL 2 MG/2ML IJ SOLN
INTRAMUSCULAR | Status: AC
  Filled 2012-05-10: qty 2

## 2012-05-10 MED ORDER — GLYCINE 1.5 % IR SOLN
Status: DC | PRN
  Administered 2012-05-10: 3000 mL

## 2012-05-10 MED ORDER — METOCLOPRAMIDE HCL 5 MG/ML IJ SOLN
INTRAMUSCULAR | Status: AC
  Administered 2012-05-10: 10 mg via INTRAVENOUS
  Filled 2012-05-10: qty 2

## 2012-05-10 MED ORDER — ONDANSETRON HCL 4 MG/2ML IJ SOLN
INTRAMUSCULAR | Status: DC | PRN
  Administered 2012-05-10: 4 mg via INTRAVENOUS

## 2012-05-10 MED ORDER — SCOPOLAMINE 1 MG/3DAYS TD PT72
MEDICATED_PATCH | TRANSDERMAL | Status: AC
  Administered 2012-05-10: 1.5 mg via TRANSDERMAL
  Filled 2012-05-10: qty 1

## 2012-05-10 MED ORDER — SCOPOLAMINE 1 MG/3DAYS TD PT72: 1.0000 | MEDICATED_PATCH | TRANSDERMAL | Status: DC

## 2012-05-10 MED ORDER — PROPOFOL 10 MG/ML IV EMUL
INTRAVENOUS | Status: DC | PRN
  Administered 2012-05-10: 150 mg via INTRAVENOUS

## 2012-05-10 MED ORDER — LIDOCAINE HCL (CARDIAC) 20 MG/ML IV SOLN
INTRAVENOUS | Status: DC | PRN
  Administered 2012-05-10: 80 mg via INTRAVENOUS

## 2012-05-10 MED ORDER — CEFAZOLIN SODIUM-DEXTROSE 2-3 GM-% IV SOLR
2.0000 g | INTRAVENOUS | Status: AC
  Administered 2012-05-10: 2 g via INTRAVENOUS

## 2012-05-10 MED ORDER — METOCLOPRAMIDE HCL 5 MG/ML IJ SOLN: 10.0000 mg | Freq: Once | INTRAMUSCULAR | Status: AC

## 2012-05-10 MED ORDER — LIDOCAINE HCL (CARDIAC) 20 MG/ML IV SOLN
INTRAVENOUS | Status: AC
  Filled 2012-05-10: qty 5

## 2012-05-10 MED ORDER — MIDAZOLAM HCL 5 MG/5ML IJ SOLN
INTRAMUSCULAR | Status: DC | PRN
  Administered 2012-05-10: 1 mg via INTRAVENOUS

## 2012-05-10 MED ORDER — FENTANYL CITRATE 0.05 MG/ML IJ SOLN
INTRAMUSCULAR | Status: DC | PRN
  Administered 2012-05-10: 50 ug via INTRAVENOUS

## 2012-05-10 MED ORDER — LACTATED RINGERS IV SOLN
INTRAVENOUS | Status: DC
  Administered 2012-05-10 (×2): via INTRAVENOUS

## 2012-05-10 MED ORDER — DEXAMETHASONE SODIUM PHOSPHATE 4 MG/ML IJ SOLN
INTRAMUSCULAR | Status: DC | PRN
  Administered 2012-05-10: 10 mg via INTRAVENOUS

## 2012-05-10 MED ORDER — DEXAMETHASONE SODIUM PHOSPHATE 10 MG/ML IJ SOLN
INTRAMUSCULAR | Status: AC
  Filled 2012-05-10: qty 1

## 2012-05-10 MED ORDER — EPHEDRINE SULFATE 50 MG/ML IJ SOLN
INTRAMUSCULAR | Status: DC | PRN
  Administered 2012-05-10: 5 mg via INTRAVENOUS

## 2012-05-10 SURGICAL SUPPLY — 15 items
ABLATOR ENDOMETRIAL BIPOLAR (ABLATOR) IMPLANT
CANISTER SUCTION 2500CC (MISCELLANEOUS) ×2 IMPLANT
CATH ROBINSON RED A/P 16FR (CATHETERS) ×2 IMPLANT
CLOTH BEACON ORANGE TIMEOUT ST (SAFETY) ×2 IMPLANT
CONTAINER PREFILL 10% NBF 60ML (FORM) ×4 IMPLANT
DRESSING TELFA 8X3 (GAUZE/BANDAGES/DRESSINGS) ×2 IMPLANT
ELECT REM PT RETURN 9FT ADLT (ELECTROSURGICAL) ×2
ELECTRODE REM PT RTRN 9FT ADLT (ELECTROSURGICAL) ×1 IMPLANT
GLOVE BIO SURGEON STRL SZ7.5 (GLOVE) ×2 IMPLANT
GOWN STRL REIN XL XLG (GOWN DISPOSABLE) ×4 IMPLANT
LOOP ANGLED CUTTING 22FR (CUTTING LOOP) IMPLANT
PACK HYSTEROSCOPY LF (CUSTOM PROCEDURE TRAY) ×2 IMPLANT
PAD OB MATERNITY 4.3X12.25 (PERSONAL CARE ITEMS) ×2 IMPLANT
TOWEL OR 17X24 6PK STRL BLUE (TOWEL DISPOSABLE) ×4 IMPLANT
WATER STERILE IRR 1000ML POUR (IV SOLUTION) ×2 IMPLANT

## 2012-05-10 NOTE — Brief Op Note (Signed)
05/10/2012  10:56 AM  PATIENT:  Beth Sawyer  62 y.o. female  PRE-OPERATIVE DIAGNOSIS:  POSTMENOPAUSAL BLEEDING  POST-OPERATIVE DIAGNOSIS:  postmenopausal bleeding; endometrial polyps  PROCEDURE:  Procedure(s): DILATATION AND CURETTAGE /HYSTEROSCOPY (N/A)  SURGEON:  Surgeon(s) and Role:    * Miguel Aschoff, MD - Primary    ANESTHESIA:   general  EBL:  Total I/O In: 700 [I.V.:700] Out: -   BLOOD ADMINISTERED:none  DRAINS: none   LOCAL MEDICATIONS USED:  LIDOCAINE   SPECIMEN:  Source of Specimen:  polyps and endometrial currettings  DISPOSITION OF SPECIMEN:  PATHOLOGY  COUNTS:  YES  TOURNIQUET:  * No tourniquets in log *  DICTATION: .Other Dictation: Dictation Number [id number]  PLAN OF CARE: Discharge to home after PACU  PATIENT DISPOSITION:  PACU - hemodynamically stable.

## 2012-05-10 NOTE — Anesthesia Procedure Notes (Signed)
Procedure Name: LMA Insertion Date/Time: 05/10/2012 10:35 AM Performed by: Graciela Husbands Pre-anesthesia Checklist: Suction available, Patient being monitored, Emergency Drugs available, Timeout performed and Patient identified Patient Re-evaluated:Patient Re-evaluated prior to inductionOxygen Delivery Method: Circle system utilized Preoxygenation: Pre-oxygenation with 100% oxygen Intubation Type: IV induction LMA: LMA inserted LMA Size: 4.0 Number of attempts: 1 Placement Confirmation: positive ETCO2 and breath sounds checked- equal and bilateral Tube secured with: Tape Dental Injury: Teeth and Oropharynx as per pre-operative assessment

## 2012-05-10 NOTE — Transfer of Care (Signed)
Immediate Anesthesia Transfer of Care Note  Patient: Beth Sawyer  Procedure(s) Performed: Procedure(s): DILATATION AND CURETTAGE /HYSTEROSCOPY (N/A)  Patient Location: PACU  Anesthesia Type:General  Level of Consciousness: awake, alert  and oriented  Airway & Oxygen Therapy: Patient Spontanous Breathing and Patient connected to nasal cannula oxygen  Post-op Assessment: Report given to PACU RN and Post -op Vital signs reviewed and stable  Post vital signs: Reviewed and stable  Complications: No apparent anesthesia complications

## 2012-05-10 NOTE — H&P (Signed)
  Status unchanged will proceed with D&C hysteroscopy. 

## 2012-05-10 NOTE — Anesthesia Postprocedure Evaluation (Signed)
  Anesthesia Post-op Note  Anesthesia Post Note  Patient: Beth Sawyer  Procedure(s) Performed: Procedure(s) (LRB): DILATATION AND CURETTAGE /HYSTEROSCOPY (N/A)  Anesthesia type: General  Patient location: PACU  Post pain: Pain level controlled  Post assessment: Post-op Vital signs reviewed  Last Vitals:  Filed Vitals:   05/10/12 1300  BP:   Pulse: 67  Temp: 36.2 C  Resp: 20    Post vital signs: Reviewed  Level of consciousness: sedated  Complications: No apparent anesthesia complications

## 2012-05-10 NOTE — Anesthesia Preprocedure Evaluation (Signed)
Anesthesia Evaluation  Patient identified by MRN, date of birth, ID band Patient awake    Reviewed: Allergy & Precautions, H&P , Patient's Chart, lab work & pertinent test results, reviewed documented beta blocker date and time   Airway Mallampati: II TM Distance: >3 FB Neck ROM: full    Dental no notable dental hx.    Pulmonary  breath sounds clear to auscultation  Pulmonary exam normal       Cardiovascular hypertension, Pt. on medications and Pt. on home beta blockers Rhythm:regular Rate:Normal     Neuro/Psych    GI/Hepatic Controlled,  Endo/Other    Renal/GU      Musculoskeletal   Abdominal   Peds  Hematology   Anesthesia Other Findings GA for same procedure 5 yr ago. No diff with GA  Reproductive/Obstetrics                           Anesthesia Physical Anesthesia Plan  ASA: II  Anesthesia Plan: General   Post-op Pain Management:    Induction: Intravenous  Airway Management Planned: LMA  Additional Equipment:   Intra-op Plan:   Post-operative Plan:   Informed Consent: I have reviewed the patients History and Physical, chart, labs and discussed the procedure including the risks, benefits and alternatives for the proposed anesthesia with the patient or authorized representative who has indicated his/her understanding and acceptance.   Dental Advisory Given  Plan Discussed with: CRNA and Surgeon  Anesthesia Plan Comments: (  Discussed  general anesthesia, including possible nausea, instrumentation of airway, sore throat,pulmonary aspiration, etc. I asked if the were any outstanding questions, or  concerns before we proceeded. )        Anesthesia Quick Evaluation

## 2012-05-10 NOTE — Op Note (Signed)
NAMEMELIZA, [name] NO.:  [id number]  MEDICAL RECORD NO.:  [id number]  LOCATION:  [facility]                          FACILITY:  [facility]  PHYSICIAN:  Miguel Aschoff, M.D.       DATE OF BIRTH:  December 14, 1950  DATE OF PROCEDURE: DATE OF DISCHARGE:  05/10/2012                              OPERATIVE REPORT   PREOPERATIVE DIAGNOSIS:  Postmenopausal bleeding.  POSTOPERATIVE DIAGNOSIS:  Multiple endometrial polyps.  PROCEDURE: 1. Cervical dilatation. 2. Hysteroscopy. 3. Removal of endometrial polyps. 4. Uterine curettage.  SURGEON:  Miguel Aschoff, M.D.  ANESTHESIA:  General.  COMPLICATIONS:  None.  JUSTIFICATION:  The patient is a 62 year old white female with history of postmenopausal bleeding.  Due to the bleeding, she is being taken to the operating room at this time to undergo D and C and hysteroscopy in an effort to identify the source of bleeding and rule out any endometrial neoplasia or polyps.  The patient does have a previous history of endometrial polyps treated in 2010.  Informed consent has been obtained.  DESCRIPTION OF PROCEDURE:  The patient was taken to the operating room, placed in supine position.  General anesthesia was administered without difficulty.  She was then placed in the dorsal lithotomy position, prepped and draped in the usual sterile fashion.  Bladder was catheterized.  Speculum was placed in the vaginal vault.  Anterior cervical lip grasped with a tenaculum and then dilated using serial Pratt dilators until a #23 Pratt dilator could be passed.  Then using the diagnostic hysteroscope, the hysteroscope was advanced under direct visualization through the endocervical canal.  No endocervical lesions were noted.  On entering the endometrial cavity, however, multiple polyps were seen filling the endometrial cavity arising from left-to- right posterior walls.  At this point, the hysteroscope was removed. Polyp forceps were introduced and it was  possible to remove all the polyps using polyp forceps and confirming that they had been removed by looking them under hysteroscope.  On completion of removal of polyp, sharp vigorous curettage was carried out, and the small amount of additional tissue obtained was sent for histologic study.  At this point, the hysteroscope was removed.  The cervix was injected with 10 mL of 1% Xylocaine for postop analgesia.  The patient was reversed from anesthetic and brought to the recovery room in satisfactory condition. The estimated blood loss was approximately less than 20 mL.  The fluid deficit was 85 mL.  The patient tolerated the procedure well and went to the recovery room in satisfactory condition.  The plan is for the patient be discharged home.  She is to call on March 20 for a pathology report.  She is to call for any problems such as fever, pain, or heavy bleeding.  Medications for home include Cataflam 50 mg t.i.d. p.r.n. pain.  She will be seen back in 4 weeks for follow up examination.     Miguel Aschoff, M.D.     AR/MEDQ  D:  05/10/2012  T:  05/10/2012  Job:  [id number]

## 2012-05-12 ENCOUNTER — Encounter (HOSPITAL_COMMUNITY): Payer: Self-pay | Admitting: Obstetrics and Gynecology

## 2012-08-30 ENCOUNTER — Other Ambulatory Visit (HOSPITAL_COMMUNITY): Payer: Self-pay | Admitting: Physician Assistant

## 2012-08-30 DIAGNOSIS — Z780 Asymptomatic menopausal state: Secondary | ICD-10-CM

## 2012-08-30 DIAGNOSIS — Z1231 Encounter for screening mammogram for malignant neoplasm of breast: Secondary | ICD-10-CM

## 2012-10-20 ENCOUNTER — Ambulatory Visit (HOSPITAL_COMMUNITY)
Admission: RE | Admit: 2012-10-20 | Discharge: 2012-10-20 | Disposition: A | Discharge status: Home / Self Care | Payer: BC Managed Care – PPO | Source: Ambulatory Visit | Attending: Physician Assistant | Admitting: Physician Assistant

## 2012-10-20 DIAGNOSIS — Z13820 Encounter for screening for osteoporosis: Secondary | ICD-10-CM | POA: Insufficient documentation

## 2012-10-20 DIAGNOSIS — Z780 Asymptomatic menopausal state: Secondary | ICD-10-CM | POA: Insufficient documentation

## 2012-10-20 DIAGNOSIS — Z1231 Encounter for screening mammogram for malignant neoplasm of breast: Secondary | ICD-10-CM | POA: Insufficient documentation

## 2013-04-11 ENCOUNTER — Encounter: Payer: Self-pay | Admitting: Gastroenterology

## 2013-04-26 ENCOUNTER — Encounter: Payer: Self-pay | Admitting: Gastroenterology

## 2013-05-31 ENCOUNTER — Ambulatory Visit (AMBULATORY_SURGERY_CENTER): Payer: Self-pay | Admitting: *Deleted

## 2013-05-31 VITALS — Ht 60.0 in | Wt 167.4 lb

## 2013-05-31 DIAGNOSIS — Z1211 Encounter for screening for malignant neoplasm of colon: Secondary | ICD-10-CM

## 2013-05-31 MED ORDER — NA SULFATE-K SULFATE-MG SULF 17.5-3.13-1.6 GM/177ML PO SOLN: 1.0000 | Freq: Once | ORAL | Status: DC

## 2013-05-31 NOTE — Progress Notes (Signed)
No allergies to eggs or soy. No problems with anesthesia.  

## 2013-06-01 ENCOUNTER — Encounter: Payer: Self-pay | Admitting: Gastroenterology

## 2013-06-08 ENCOUNTER — Other Ambulatory Visit: Payer: Self-pay | Admitting: Obstetrics and Gynecology

## 2013-06-14 ENCOUNTER — Ambulatory Visit (AMBULATORY_SURGERY_CENTER): Payer: BC Managed Care – PPO | Admitting: Gastroenterology

## 2013-06-14 ENCOUNTER — Encounter: Payer: Self-pay | Admitting: Gastroenterology

## 2013-06-14 VITALS — BP 133/69 | HR 65 | Temp 98.4°F | Resp 21 | Ht 60.0 in | Wt 167.0 lb

## 2013-06-14 DIAGNOSIS — D126 Benign neoplasm of colon, unspecified: Secondary | ICD-10-CM

## 2013-06-14 DIAGNOSIS — Z1211 Encounter for screening for malignant neoplasm of colon: Secondary | ICD-10-CM

## 2013-06-14 MED ORDER — SODIUM CHLORIDE 0.9 % IV SOLN: 500.0000 mL | INTRAVENOUS | Status: DC

## 2013-06-14 NOTE — Progress Notes (Signed)
Report to pacu rn, vss, bbs=clear 

## 2013-06-14 NOTE — Progress Notes (Signed)
Called to room to assist during endoscopic procedure.  Patient ID and intended procedure confirmed with present staff. Received instructions for my participation in the procedure from the performing physician.  

## 2013-06-14 NOTE — Op Note (Signed)
[facility]  [address] [place], [address]   COLONOSCOPY PROCEDURE REPORT  PATIENT: Orlanda, Frankum  MR#: [id number] BIRTHDATE: July 26, 1950 , 62  yrs. old GENDER: Female ENDOSCOPIST: Inda Castle, MD REFERRED BY: PROCEDURE DATE:  06/14/2013 PROCEDURE:   Colonoscopy with snare polypectomy and Colonoscopy with cold biopsy polypectomy First Screening Colonoscopy - Avg.  risk and is 50 yrs.  old or older - No.  Prior Negative Screening - Now for repeat screening. 10 or more years since last screening  History of Adenoma - Now for follow-up colonoscopy & has been > or = to 3 yrs.  N/A  Polyps Removed Today? Yes. ASA CLASS:   Class II INDICATIONS:Average risk patient for colon cancer. MEDICATIONS: MAC sedation, administered by CRNA and propofol (Diprivan) 250mg  IV  DESCRIPTION OF PROCEDURE:   After the risks benefits and alternatives of the procedure were thoroughly explained, informed consent was obtained.  A digital rectal exam revealed no abnormalities of the rectum.   The LB [id number]  endoscope was introduced through the anus and advanced to the cecum, which was identified by both the appendix and ileocecal valve. No adverse events experienced.   The quality of the prep was Suprep good  The instrument was then slowly withdrawn as the colon was fully examined.      COLON FINDINGS: A flat polyp measuring 2-3 mm in size was found at the cecum.(photo not available)  A polypectomy was performed with a cold snare and with cold forceps.  The resection was complete and the polyp tissue was completely retrieved.   Moderate diverticulosis was noted in the sigmoid colon.   Internal hemorrhoids were found.  Retroflexed views revealed no abnormalities. The time to cecum=2 minutes 15 seconds.  Withdrawal time=10 minutes 35 seconds.  The scope was withdrawn and the procedure completed. COMPLICATIONS: There were no complications.  ENDOSCOPIC  IMPRESSION: 1.   Flat polyp measuring 2-3 mm in size was found at the cecum; polypectomy was performed with a cold snare and with cold forceps 2.   Moderate diverticulosis was noted in the sigmoid colon 3.   Internal hemorrhoids  RECOMMENDATIONS: If the polyp(s) removed today are proven to be adenomatous (pre-cancerous) polyps, you will need a repeat colonoscopy in 5 years.  Otherwise you should continue to follow colorectal cancer screening guidelines for "routine risk" patients with colonoscopy in 10 years.  You will receive a letter within 1-2 weeks with the results of your biopsy as well as final recommendations.  Please call my office if you have not received a letter after 3 weeks.   eSigned:  Inda Castle, MD 06/14/2013 9:59 AM   cc: Mattie Marlin, MD   PATIENT NAME:  Humaira, Sculley MR#: [id number]

## 2013-06-14 NOTE — Patient Instructions (Signed)
Discharge instructions given with verbal understanding. Handouts on polyps,diverticulosis and hemorrhoids. Resume previous medications. YOU HAD AN ENDOSCOPIC PROCEDURE TODAY AT THE [facility]: Refer to the procedure report that was given to you for any specific questions about what was found during the examination.  If the procedure report does not answer your questions, please call your gastroenterologist to clarify.  If you requested that your care partner not be given the details of your procedure findings, then the procedure report has been included in a sealed envelope for you to review at your convenience later.  YOU SHOULD EXPECT: Some feelings of bloating in the abdomen. Passage of more gas than usual.  Walking can help get rid of the air that was put into your GI tract during the procedure and reduce the bloating. If you had a lower endoscopy (such as a colonoscopy or flexible sigmoidoscopy) you may notice spotting of blood in your stool or on the toilet paper. If you underwent a bowel prep for your procedure, then you may not have a normal bowel movement for a few days.  DIET: Your first meal following the procedure should be a light meal and then it is ok to progress to your normal diet.  A half-sandwich or bowl of soup is an example of a good first meal.  Heavy or fried foods are harder to digest and may make you feel nauseous or bloated.  Likewise meals heavy in dairy and vegetables can cause extra gas to form and this can also increase the bloating.  Drink plenty of fluids but you should avoid alcoholic beverages for 24 hours.  ACTIVITY: Your care partner should take you home directly after the procedure.  You should plan to take it easy, moving slowly for the rest of the day.  You can resume normal activity the day after the procedure however you should NOT DRIVE or use heavy machinery for 24 hours (because of the sedation medicines used during the test).    SYMPTOMS TO REPORT  IMMEDIATELY: A gastroenterologist can be reached at any hour.  During normal business hours, 8:30 AM to 5:00 PM Monday through Friday, call [phone].  After hours and on weekends, please call the GI answering service at [phone] who will take a message and have the physician on call contact you.   Following lower endoscopy (colonoscopy or flexible sigmoidoscopy):  Excessive amounts of blood in the stool  Significant tenderness or worsening of abdominal pains  Swelling of the abdomen that is new, acute  Fever of 100F or higher  FOLLOW UP: If any biopsies were taken you will be contacted by phone or by letter within the next 1-3 weeks.  Call your gastroenterologist if you have not heard about the biopsies in 3 weeks.  Our staff will call the home number listed on your records the next business day following your procedure to check on you and address any questions or concerns that you may have at that time regarding the information given to you following your procedure. This is a courtesy call and so if there is no answer at the home number and we have not heard from you through the emergency physician on call, we will assume that you have returned to your regular daily activities without incident.  SIGNATURES/CONFIDENTIALITY: You and/or your care partner have signed paperwork which will be entered into your electronic medical record.  These signatures attest to the fact that that the information above on your After Visit Summary   has been reviewed and is understood.  Full responsibility of the confidentiality of this discharge information lies with you and/or your care-partner. 

## 2013-06-15 ENCOUNTER — Telehealth: Payer: Self-pay | Admitting: *Deleted

## 2013-06-15 NOTE — Telephone Encounter (Signed)
  Follow up Call-  Call back number 06/14/2013  Post procedure Call Back phone  # [phone]  Permission to leave phone message Yes     Patient questions:  Do you have a fever, pain , or abdominal swelling? no Pain Score  0 *  Have you tolerated food without any problems? yes  Have you been able to return to your normal activities? yes  Do you have any questions about your discharge instructions: Diet   no Medications  no Follow up visit  no  Do you have questions or concerns about your Care? no  Actions: * If pain score is 4 or above: No action needed, pain <4.

## 2013-06-20 ENCOUNTER — Encounter: Payer: Self-pay | Admitting: Gastroenterology

## 2013-09-22 ENCOUNTER — Other Ambulatory Visit (HOSPITAL_COMMUNITY): Payer: Self-pay | Admitting: Obstetrics and Gynecology

## 2013-09-22 DIAGNOSIS — Z1231 Encounter for screening mammogram for malignant neoplasm of breast: Secondary | ICD-10-CM

## 2013-10-23 ENCOUNTER — Ambulatory Visit (HOSPITAL_COMMUNITY)
Admission: RE | Admit: 2013-10-23 | Discharge: 2013-10-23 | Disposition: A | Discharge status: Home / Self Care | Payer: BC Managed Care – PPO | Source: Ambulatory Visit | Attending: Obstetrics and Gynecology | Admitting: Obstetrics and Gynecology

## 2013-10-23 DIAGNOSIS — Z1231 Encounter for screening mammogram for malignant neoplasm of breast: Secondary | ICD-10-CM | POA: Diagnosis not present

## 2013-10-23 DIAGNOSIS — Z124 Encounter for screening for malignant neoplasm of cervix: Secondary | ICD-10-CM | POA: Diagnosis present

## 2014-01-01 ENCOUNTER — Emergency Department (HOSPITAL_COMMUNITY)
Admission: EM | Admit: 2014-01-01 | Discharge: 2014-01-01 | Disposition: A | Discharge status: Home / Self Care | Payer: BC Managed Care – PPO | Attending: Emergency Medicine | Admitting: Emergency Medicine

## 2014-01-01 ENCOUNTER — Encounter (HOSPITAL_COMMUNITY): Payer: Self-pay | Admitting: Emergency Medicine

## 2014-01-01 ENCOUNTER — Emergency Department (HOSPITAL_COMMUNITY): Payer: BC Managed Care – PPO

## 2014-01-01 DIAGNOSIS — R0789 Other chest pain: Secondary | ICD-10-CM | POA: Insufficient documentation

## 2014-01-01 DIAGNOSIS — Z79899 Other long term (current) drug therapy: Secondary | ICD-10-CM | POA: Insufficient documentation

## 2014-01-01 DIAGNOSIS — Z8739 Personal history of other diseases of the musculoskeletal system and connective tissue: Secondary | ICD-10-CM | POA: Insufficient documentation

## 2014-01-01 DIAGNOSIS — E079 Disorder of thyroid, unspecified: Secondary | ICD-10-CM | POA: Insufficient documentation

## 2014-01-01 DIAGNOSIS — H409 Unspecified glaucoma: Secondary | ICD-10-CM | POA: Insufficient documentation

## 2014-01-01 DIAGNOSIS — I10 Essential (primary) hypertension: Secondary | ICD-10-CM | POA: Diagnosis not present

## 2014-01-01 DIAGNOSIS — K219 Gastro-esophageal reflux disease without esophagitis: Secondary | ICD-10-CM | POA: Insufficient documentation

## 2014-01-01 DIAGNOSIS — Z8659 Personal history of other mental and behavioral disorders: Secondary | ICD-10-CM | POA: Diagnosis not present

## 2014-01-01 DIAGNOSIS — R079 Chest pain, unspecified: Secondary | ICD-10-CM

## 2014-01-01 LAB — BASIC METABOLIC PANEL
Anion gap: 10 (ref 5–15)
BUN: 7 mg/dL (ref 6–23)
CO2: 27 mEq/L (ref 19–32)
Calcium: 9.1 mg/dL (ref 8.4–10.5)
Chloride: 106 mEq/L (ref 96–112)
Creatinine, Ser: 0.75 mg/dL (ref 0.50–1.10)
GFR calc Af Amer: >90 mL/min (ref >90)
GFR calc non Af Amer: 88 mL/min — ABNORMAL LOW (ref >90)
Glucose, Bld: 103 mg/dL — ABNORMAL HIGH (ref 70–99)
Potassium: 4.1 mEq/L (ref 3.7–5.3)
Sodium: 143 mEq/L (ref 137–147)

## 2014-01-01 LAB — CBC
HCT: 41.4 % (ref 36.0–46.0)
Hemoglobin: 13.7 g/dL (ref 12.0–15.0)
MCH: 30.6 pg (ref 26.0–34.0)
MCHC: 33.1 g/dL (ref 30.0–36.0)
MCV: 92.6 fL (ref 78.0–100.0)
Platelets: 282 10*3/uL (ref 150–400)
RBC: 4.47 MIL/uL (ref 3.87–5.11)
RDW: 12.8 % (ref 11.5–15.5)
WBC: 5.6 10*3/uL (ref 4.0–10.5)

## 2014-01-01 LAB — TROPONIN I
Troponin I: <0.3 ng/mL (ref <0.30)
Troponin I: <0.3 ng/mL (ref <0.30)

## 2014-01-01 MED ORDER — KETOROLAC TROMETHAMINE 30 MG/ML IJ SOLN
15.0000 mg | Freq: Once | INTRAMUSCULAR | Status: AC
  Administered 2014-01-01: 15 mg via INTRAVENOUS
  Filled 2014-01-01: qty 1

## 2014-01-01 MED ORDER — GI COCKTAIL ~~LOC~~
30.0000 mL | Freq: Once | ORAL | Status: AC
  Administered 2014-01-01: 30 mL via ORAL
  Filled 2014-01-01: qty 30

## 2014-01-01 MED ORDER — TRAMADOL HCL 50 MG PO TABS: 50.0000 mg | ORAL_TABLET | Freq: Four times a day (QID) | ORAL | Status: DC | PRN

## 2014-01-01 NOTE — ED Notes (Signed)
Phlebotomy at the bedside  

## 2014-01-01 NOTE — ED Notes (Signed)
MD aware patient is ready to see him.

## 2014-01-01 NOTE — ED Notes (Signed)
Pt c/o mid sternal CP with some SOB x 4 days

## 2014-01-01 NOTE — Discharge Instructions (Signed)

## 2014-01-01 NOTE — ED Notes (Signed)
MD Kohut at the bedside.

## 2014-01-01 NOTE — ED Provider Notes (Signed)
CSN: [id number]     Arrival date & time 01/01/14  1093 History   First MD Initiated Contact with Patient 01/01/14 [phone]     Chief Complaint  Patient presents with  . Chest Pain     (Consider location/radiation/quality/duration/timing/severity/associated sxs/prior Treatment) HPI   63 year old female with chest pain. Intermittent over the past 4 days. Describes a midsternal pressure last anywhere from a few seconds up to a couple minutes. Sometimes his history was some breath, but not all the time. No appreciable exacerbating relieving factors. No nausea. No diaphoresis. No palpitations. No unusual leg pain or swelling.never smoker. Denies drug use.  Past Medical History  Diagnosis Date  . Thyroid disease   . Hypertension   . GERD (gastroesophageal reflux disease)   . Borderline diabetes   . Anxiety   . Arthritis   . Glaucoma    Past Surgical History  Procedure Laterality Date  . Dilation and curettage of uterus      X3  . Cesarean section  1971, 1975, 1978    x3  . Breast biopsy Right 1986  . Hysteroscopy w/d&c N/A 05/10/2012    Procedure: DILATATION AND CURETTAGE /HYSTEROSCOPY;  Surgeon: Gus Height, MD;  Location: [facility];  Service: Gynecology;  Laterality: N/A;  . Cataract extraction Right 2014   Family History  Problem Relation Age of Onset  . Colon cancer Neg Hx    History  Substance Use Topics  . Smoking status: Never Smoker   . Smokeless tobacco: Never Used  . Alcohol Use: No   OB History    No data available     Review of Systems    Allergies  Morphine and related; Codeine; and Oxycodone  Home Medications   Prior to Admission medications   Medication Sig Start Date End Date Taking? Authorizing Provider  calcium carbonate (TUMS - DOSED IN MG ELEMENTAL CALCIUM) 500 MG chewable tablet Chew 1 tablet by mouth 2 (two) times daily as needed for indigestion or heartburn.   Yes Historical Provider, MD  Calcium Carbonate-Vitamin D (CALTRATE 600+D PO) Take 1  tablet by mouth daily.    Yes Historical Provider, MD  Chlorpheniramine Maleate (ALLERGY RELIEF PO) Take 1 tablet by mouth daily.    Yes Historical Provider, MD  Cholecalciferol (VITAMIN D-3 PO) Take 2 tablets by mouth daily.   Yes Historical Provider, MD  levothyroxine (LEVOXYL) 75 MCG tablet Take 75 mcg by mouth daily.   Yes Historical Provider, MD  metoprolol succinate (TOPROL-XL) 50 MG 24 hr tablet Take 50 mg by mouth daily. Take with or immediately following a meal.   Yes Historical Provider, MD  Multiple Vitamin (MULITIVITAMIN WITH MINERALS) TABS Take 1 tablet by mouth daily.   Yes Historical Provider, MD  Omega-3 Fatty Acids (FISH OIL) 1000 MG CAPS Take 2 capsules by mouth daily.   Yes Historical Provider, MD  Omeprazole-Sodium Bicarbonate (ZEGERID PO) Take 1 tablet by mouth daily.   Yes Historical Provider, MD  timolol (TIMOPTIC) 0.5 % ophthalmic solution Place 1 drop into both eyes daily.    Yes Historical Provider, MD  vitamin B-12 (CYANOCOBALAMIN) 500 MCG tablet Take 500 mcg by mouth daily.   Yes Historical Provider, MD   BP 137/61 mmHg  Pulse 59  Temp(Src) 98 F (36.7 C) (Oral)  Resp 15  Ht 5\' 1"  (1.549 m)  Wt 165 lb (74.844 kg)  BMI 31.19 kg/m2  SpO2 96% Physical Exam  Constitutional: She appears well-developed and well-nourished. No distress.  HENT:  Head: Normocephalic  and atraumatic.  Eyes: Conjunctivae are normal. Right eye exhibits no discharge. Left eye exhibits no discharge.  Neck: Neck supple.  Cardiovascular: Normal rate, regular rhythm and normal heart sounds.  Exam reveals no gallop and no friction rub.   No murmur heard. Pulmonary/Chest: Effort normal and breath sounds normal. No respiratory distress.  Abdominal: Soft. She exhibits no distension. There is no tenderness.  Musculoskeletal: She exhibits no edema or tenderness.  Lower extremities symmetric as compared to each other. No calf tenderness. Negative Homan's. No palpable cords.  Neurological: She is  alert.  Skin: Skin is warm and dry.  Psychiatric: She has a normal mood and affect. Her behavior is normal. Thought content normal.  Nursing note and vitals reviewed.   ED Course  Procedures (including critical care time) Labs Review Labs Reviewed  BASIC METABOLIC PANEL - Abnormal; Notable for the following:    Glucose, Bld 103 (*)    GFR calc non Af Amer 88 (*)    All other components within normal limits  CBC  TROPONIN I  TROPONIN I    Imaging Review Dg Chest 2 View  01/01/2014   CLINICAL DATA:  Mid chest pain.  History of diabetes.  EXAM: CHEST  2 VIEW  COMPARISON:  April 16, 2011  FINDINGS: The heart size and mediastinal contours are within normal limits. There is no focal infiltrate, pulmonary edema, or pleural effusion. The visualized skeletal structures are unremarkable.  IMPRESSION: No active cardiopulmonary disease.   Electronically Signed   By: Abelardo Diesel M.D.   On: 01/01/2014 09:37     EKG Interpretation   Date/Time:  Monday January 01 2014 08:40:31 EST Ventricular Rate:  59 PR Interval:  156 QRS Duration: 72 QT Interval:  420 QTC Calculation: 415 R Axis:   51 Text Interpretation:  Sinus bradycardia Low voltage QRS since last tracing  no significant change Confirmed by Azrael Huss  MD, Daenerys Buttram ([id number]) on 01/01/2014  8:42:58 AM      MDM   Final diagnoses:  Chest pain, unspecified chest pain type    63 year old female with chest pain. Doubt ACS. Doubt pulmonary embolism. Dissection. Chest x-ray with no acute abnormality. He did medically stable. EKG with any acute changes. Troponin normal 2. I feel stable for discharge at this time. Feel she would benefit from stress testing but that this can be done as outpatient study. Discussed need to follow-up with her PCP to discuss further.    Virgel Manifold, MD 01/03/14 [phone]

## 2014-01-08 ENCOUNTER — Telehealth: Payer: Self-pay | Admitting: Gastroenterology

## 2014-01-08 NOTE — Telephone Encounter (Signed)
She has been evaluated by the ER. Chest pain was ruled as non-cardiac. Patient has taken Zegrid, gas-x and tums without relief. She is now experiencing loose stools. Appointment scheduled.

## 2014-01-09 ENCOUNTER — Ambulatory Visit (INDEPENDENT_AMBULATORY_CARE_PROVIDER_SITE_OTHER): Payer: BC Managed Care – PPO | Admitting: Gastroenterology

## 2014-01-09 ENCOUNTER — Encounter: Payer: Self-pay | Admitting: Gastroenterology

## 2014-01-09 VITALS — BP 110/60 | HR 59 | Ht 61.0 in | Wt 165.6 lb

## 2014-01-09 DIAGNOSIS — K219 Gastro-esophageal reflux disease without esophagitis: Secondary | ICD-10-CM

## 2014-01-09 DIAGNOSIS — R0789 Other chest pain: Secondary | ICD-10-CM | POA: Insufficient documentation

## 2014-01-09 MED ORDER — PANTOPRAZOLE SODIUM 40 MG PO TBEC: 40.0000 mg | DELAYED_RELEASE_TABLET | Freq: Every day | ORAL | Status: DC

## 2014-01-09 NOTE — Patient Instructions (Signed)
You have been scheduled for an endoscopy. Please follow written instructions given to you at your visit today. If you use inhalers (even only as needed), please bring them with you on the day of your procedure.  Pantoprazole 40 mg was sent to your pharmacy, please take one tablet by mouth once daily   Please avoid NSAID's _____________________________________________________________________________________________________________________________________________________ Gastroesophageal Reflux Disease, Adult Gastroesophageal reflux disease (GERD) happens when acid from your stomach flows up into the esophagus. When acid comes in contact with the esophagus, the acid causes soreness (inflammation) in the esophagus. Over time, GERD may create small holes (ulcers) in the lining of the esophagus. CAUSES   Increased body weight. This puts pressure on the stomach, making acid rise from the stomach into the esophagus.  Smoking. This increases acid production in the stomach.  Drinking alcohol. This causes decreased pressure in the lower esophageal sphincter (valve or ring of muscle between the esophagus and stomach), allowing acid from the stomach into the esophagus.  Late evening meals and a full stomach. This increases pressure and acid production in the stomach.  A malformed lower esophageal sphincter. Sometimes, no cause is found. SYMPTOMS   Burning pain in the lower part of the mid-chest behind the breastbone and in the mid-stomach area. This may occur twice a week or more often.  Trouble swallowing.  Sore throat.  Dry cough.  Asthma-like symptoms including chest tightness, shortness of breath, or wheezing. DIAGNOSIS  Your caregiver may be able to diagnose GERD based on your symptoms. In some cases, X-rays and other tests may be done to check for complications or to check the condition of your stomach and esophagus. TREATMENT  Your caregiver may recommend over-the-counter or  prescription medicines to help decrease acid production. Ask your caregiver before starting or adding any new medicines.  HOME CARE INSTRUCTIONS   Change the factors that you can control. Ask your caregiver for guidance concerning weight loss, quitting smoking, and alcohol consumption.  Avoid foods and drinks that make your symptoms worse, such as:  Caffeine or alcoholic drinks.  Chocolate.  Peppermint or mint flavorings.  Garlic and onions.  Spicy foods.  Citrus fruits, such as oranges, lemons, or limes.  Tomato-based foods such as sauce, chili, salsa, and pizza.  Fried and fatty foods.  Avoid lying down for the 3 hours prior to your bedtime or prior to taking a nap.  Eat small, frequent meals instead of large meals.  Wear loose-fitting clothing. Do not wear anything tight around your waist that causes pressure on your stomach.  Raise the head of your bed 6 to 8 inches with wood blocks to help you sleep. Extra pillows will not help.  Only take over-the-counter or prescription medicines for pain, discomfort, or fever as directed by your caregiver.  Do not take aspirin, ibuprofen, or other nonsteroidal anti-inflammatory drugs (NSAIDs). SEEK IMMEDIATE MEDICAL CARE IF:   You have pain in your arms, neck, jaw, teeth, or back.  Your pain increases or changes in intensity or duration.  You develop nausea, vomiting, or sweating (diaphoresis).  You develop shortness of breath, or you faint.  Your vomit is green, yellow, black, or looks like coffee grounds or blood.  Your stool is red, bloody, or black. These symptoms could be signs of other problems, such as heart disease, gastric bleeding, or esophageal bleeding. MAKE SURE YOU:   Understand these instructions.  Will watch your condition.  Will get help right away if you are not doing well or get worse.  Document Released: 11/19/2004 Document Revised: 05/04/2011 Document Reviewed: 08/29/2010 [facility] Patient  Information 2015 [place], [place]. This information is not intended to replace advice given to you by your health care provider. Make sure you discuss any questions you have with your health care provider.

## 2014-01-09 NOTE — Progress Notes (Signed)
     01/09/2014 Beth Sawyer [id number] Dec 24, 1950   History of Present Illness:  This is a 63 year old female who is known to Dr. [name] for colonoscopy in 05/2013 at which time she was found to have a polyp that was removed from the cecum that was a tubular adenoma; repeat colonoscopy recommended in 5 years from that time.  Also had moderate diverticulosis and internal hemorrhoids.  She presents to our office today with complaints of chest pain and belching.  She says that she ended up in the ED one week ago for complaints of chest pain.  Pain had been present on and off for a couple of days but worsened the day of her ED visit.  She had EKG, chest X-ray, and troponins that were negative.  Is being sent for stress test but says that she had a stress test 2 years ago that was normal.  The stress test 2 years ago was performed because she had similar symptoms at that time.  Pain previously improved with PPI therapy so she recently started taking Zegerid OTC a few days ago.  She took the PPI for a while after her episode two years ago but when her symptoms improved and she was feeling well she discontinued it.  Pain is described as pressure and burning at times.  Complains of a lot of belching.  Has taken some Tums and Gas-X without much relief but has been feeling a little better overall the past few days.  Current Medications, Allergies, Past Medical History, Past Surgical History, Family History and Social History were reviewed in [employer] record.   Physical Exam: BP 110/60 mmHg  Pulse 59  Ht 5\' 1"  (1.549 m)  Wt 165 lb 9.6 oz (75.116 kg)  BMI 31.31 kg/m2  SpO2 95% General: Well developed white female in no acute distress Head: Normocephalic and atraumatic Eyes:  Sclerae anicteric, conjunctiva pink  Ears: Normal auditory acuity Lungs: Clear throughout to auscultation Heart: Regular rate and rhythm Abdomen: Soft, non-distended.  Normal bowel sounds.   Non-tender. Musculoskeletal: Symmetrical with no gross deformities  Extremities: No edema  Neurological: Alert oriented x 4, grossly non-focal Psychological:  Alert and cooperative. Normal mood and affect  Assessment and Recommendations: -Non-cardiac chest pain, belching:  Suspect GERD.  Similar symptoms in the past with resolution of symptoms with PPI.  Will continue PPI daily but will prescribe pantoprazole 40 mg.  I do not think that it is inappropriate to perform EGD, however, due to severity and recurrence of symptoms.  GERD dietary measures.  *The risks, benefits, and alternatives were discussed with the patient and she consents to proceed.

## 2014-01-10 ENCOUNTER — Encounter: Payer: Self-pay | Admitting: Gastroenterology

## 2014-01-10 ENCOUNTER — Ambulatory Visit (AMBULATORY_SURGERY_CENTER): Payer: BC Managed Care – PPO | Admitting: Gastroenterology

## 2014-01-10 VITALS — BP 131/73 | HR 62 | Temp 97.0°F | Resp 16 | Ht 61.0 in | Wt 165.0 lb

## 2014-01-10 DIAGNOSIS — K2980 Duodenitis without bleeding: Secondary | ICD-10-CM

## 2014-01-10 DIAGNOSIS — R0789 Other chest pain: Secondary | ICD-10-CM

## 2014-01-10 DIAGNOSIS — K257 Chronic gastric ulcer without hemorrhage or perforation: Secondary | ICD-10-CM

## 2014-01-10 DIAGNOSIS — K259 Gastric ulcer, unspecified as acute or chronic, without hemorrhage or perforation: Secondary | ICD-10-CM

## 2014-01-10 MED ORDER — SODIUM CHLORIDE 0.9 % IV SOLN: 500.0000 mL | INTRAVENOUS | Status: DC

## 2014-01-10 NOTE — Progress Notes (Signed)
Reviewed and agree with management. Plan for EGD Sandy Salaam. [name], M.D., [facility]

## 2014-01-10 NOTE — Patient Instructions (Signed)
YOU HAD AN ENDOSCOPIC PROCEDURE TODAY AT THE [facility]: Refer to the procedure report that was given to you for any specific questions about what was found during the examination.  If the procedure report does not answer your questions, please call your gastroenterologist to clarify.  If you requested that your care partner not be given the details of your procedure findings, then the procedure report has been included in a sealed envelope for you to review at your convenience later.  YOU SHOULD EXPECT: Some feelings of bloating in the abdomen. Passage of more gas than usual.  Walking can help get rid of the air that was put into your GI tract during the procedure and reduce the bloating. If you had a lower endoscopy (such as a colonoscopy or flexible sigmoidoscopy) you may notice spotting of blood in your stool or on the toilet paper. If you underwent a bowel prep for your procedure, then you may not have a normal bowel movement for a few days.  DIET: Your first meal following the procedure should be a light meal and then it is ok to progress to your normal diet.  A half-sandwich or bowl of soup is an example of a good first meal.  Heavy or fried foods are harder to digest and may make you feel nauseous or bloated.  Likewise meals heavy in dairy and vegetables can cause extra gas to form and this can also increase the bloating.  Drink plenty of fluids but you should avoid alcoholic beverages for 24 hours.  ACTIVITY: Your care partner should take you home directly after the procedure.  You should plan to take it easy, moving slowly for the rest of the day.  You can resume normal activity the day after the procedure however you should NOT DRIVE or use heavy machinery for 24 hours (because of the sedation medicines used during the test).    SYMPTOMS TO REPORT IMMEDIATELY: A gastroenterologist can be reached at any hour.  During normal business hours, 8:30 AM to 5:00 PM Monday through Friday,  call [phone].  After hours and on weekends, please call the GI answering service at [phone] who will take a message and have the physician on call contact you.    Following upper endoscopy (EGD)  Vomiting of blood or coffee ground material  New chest pain or pain under the shoulder blades  Painful or persistently difficult swallowing  New shortness of breath  Fever of 100F or higher  Black, tarry-looking stools  FOLLOW UP: If any biopsies were taken you will be contacted by phone or by letter within the next 1-3 weeks.  Call your gastroenterologist if you have not heard about the biopsies in 3 weeks.  Our staff will call the home number listed on your records the next business day following your procedure to check on you and address any questions or concerns that you may have at that time regarding the information given to you following your procedure. This is a courtesy call and so if there is no answer at the home number and we have not heard from you through the emergency physician on call, we will assume that you have returned to your regular daily activities without incident.  SIGNATURES/CONFIDENTIALITY: You and/or your care partner have signed paperwork which will be entered into your electronic medical record.  These signatures attest to the fact that that the information above on your After Visit Summary has been reviewed and is understood.  Full   responsibility of the confidentiality of this discharge information lies with you and/or your care-partner.   Avoid NSAIDS.  Resume remainder of medications. Follow up with DR.Kaplan January 21 st at 3:45 on the third floor.

## 2014-01-10 NOTE — Progress Notes (Signed)
Called to room to assist during endoscopic procedure.  Patient ID and intended procedure confirmed with present staff. Received instructions for my participation in the procedure from the performing physician.  

## 2014-01-10 NOTE — Progress Notes (Signed)
Pt stable to RR 

## 2014-01-10 NOTE — Op Note (Signed)
[facility]  [address] [place], [id number]   ENDOSCOPY PROCEDURE REPORT  PATIENT: Beth Sawyer, Beth Sawyer  MR#: [id number] BIRTHDATE: September 29, 1950 , 89  yrs. old GENDER: female ENDOSCOPIST: Inda Castle, MD REFERRED BY: PROCEDURE DATE:  01/10/2014 PROCEDURE:  EGD w/ biopsy ASA CLASS:     Class II INDICATIONS:  chest pain. MEDICATIONS: Monitored anesthesia care and Propofol 100 mg IV TOPICAL ANESTHETIC:  DESCRIPTION OF PROCEDURE: After the risks benefits and alternatives of the procedure were thoroughly explained, informed consent was obtained.  The LB GIF-H180 Loaner [id number] endoscope was introduced through the mouth and advanced to the second portion of the duodenum , Without limitations.  The instrument was slowly withdrawn as the mucosa was fully examined.    STOMACH: Two small non-bleeding, clean-based and shallow ulcers were found on the anterior wall of the gastric body.  Biopsies were taken.   Except for the findings listed the EGD was otherwise normal.   DUODENUM: Mild duodenal inflammation was found in the duodenal bulb.  Retroflexed views revealed no abnormalities. The scope was then withdrawn from the patient and the procedure completed.  COMPLICATIONS: There were no immediate complications.  ENDOSCOPIC IMPRESSION: 1.   Two small ulcers were found on the anterior wall of the gastric body; biopsies were taken 2.   EGD was otherwise normal 3.   Duodenal inflammation was found in the duodenal bulb  RECOMMENDATIONS: 1.  Continue PPI 2.  Avoid NSAIDS  REPEAT EXAM:  eSigned:  Inda Castle, MD 01/10/2014 3:29 PM    CC: Roe Coombs, MD

## 2014-01-11 ENCOUNTER — Telehealth: Payer: Self-pay | Admitting: *Deleted

## 2014-01-11 NOTE — Telephone Encounter (Signed)
  Follow up Call-  Call back number 01/10/2014 06/14/2013  Post procedure Call Back phone  # [phone]  Permission to leave phone message Yes Yes     Patient questions:  Do you have a fever, pain , or abdominal swelling? No. Pain Score  0 *  Have you tolerated food without any problems? Yes.    Have you been able to return to your normal activities? Yes.    Do you have any questions about your discharge instructions: Diet   No. Medications  No. Follow up visit  No.  Do you have questions or concerns about your Care? No.  Actions: * If pain score is 4 or above: No action needed, pain <4.

## 2014-01-15 ENCOUNTER — Encounter: Payer: Self-pay | Admitting: Gastroenterology

## 2014-01-30 ENCOUNTER — Ambulatory Visit: Payer: BC Managed Care – PPO | Admitting: Cardiology

## 2014-02-28 HISTORY — PX: CATARACT EXTRACTION: SUR2

## 2014-03-15 ENCOUNTER — Ambulatory Visit: Payer: BC Managed Care – PPO | Admitting: Gastroenterology

## 2014-03-30 ENCOUNTER — Encounter: Payer: Self-pay | Admitting: Cardiology

## 2014-03-30 ENCOUNTER — Ambulatory Visit (INDEPENDENT_AMBULATORY_CARE_PROVIDER_SITE_OTHER): Payer: 59 | Admitting: Cardiology

## 2014-03-30 VITALS — BP 112/70 | HR 94 | Ht 60.0 in | Wt 160.8 lb

## 2014-03-30 DIAGNOSIS — R0789 Other chest pain: Secondary | ICD-10-CM

## 2014-03-30 DIAGNOSIS — I10 Essential (primary) hypertension: Secondary | ICD-10-CM

## 2014-03-30 DIAGNOSIS — K279 Peptic ulcer, site unspecified, unspecified as acute or chronic, without hemorrhage or perforation: Secondary | ICD-10-CM

## 2014-03-30 DIAGNOSIS — K219 Gastro-esophageal reflux disease without esophagitis: Secondary | ICD-10-CM

## 2014-03-30 HISTORY — DX: Peptic ulcer, site unspecified, unspecified as acute or chronic, without hemorrhage or perforation: K27.9

## 2014-03-30 HISTORY — DX: Essential (primary) hypertension: I10

## 2014-03-30 NOTE — Patient Instructions (Signed)
Your physician recommends that you continue on your current medications as directed. Please refer to the Current Medication list given to you today.  Your physician recommends that you schedule a follow-up appointment AS NEEDED with Dr. Radford Pax. Do not hesitate to call us for any questions or concerns!

## 2014-03-30 NOTE — Progress Notes (Signed)
Cardiology Office Note   Date:  03/30/2014   ID:  Beth Sawyer, DOB [birth year], MRN [id number]  PCP:  Aura Dials, PA-C  Cardiologist:   Sueanne Margarita, MD   Chief Complaint  Patient presents with  . Chest Pain      History of Present Illness: Beth Sawyer is a 64 y.o. female who presents for evaluation of chest pain.  She went to the ER last fall for CP and workup was normal.  She was referred to GI and underwent endoscopy and was diagnosed with PUD and was started on Protonix.  Her CP has since resolved unless she drinks caffienated drinks or eats chocolate. She denies any exertional CP, SOB, DOE, LE edema, dizziness, palpitations or syncope.     Past Medical History  Diagnosis Date  . Thyroid disease   . GERD (gastroesophageal reflux disease)   . Borderline diabetes   . Anxiety   . Arthritis   . Glaucoma   . PUD (peptic ulcer disease) 03/30/2014  . Benign essential HTN 03/30/2014    Past Surgical History  Procedure Laterality Date  . Dilation and curettage of uterus      X3  . Cesarean section  1971, 1975, 1978    x3  . Breast biopsy Right 1986  . Hysteroscopy w/d&c N/A 05/10/2012    Procedure: DILATATION AND CURETTAGE /HYSTEROSCOPY;  Surgeon: Gus Height, MD;  Location: [facility];  Service: Gynecology;  Laterality: N/A;  . Cataract extraction Right 2014     Current Outpatient Prescriptions  Medication Sig Dispense Refill  . Calcium Carbonate-Vit D-Min (CALTRATE 600+D PLUS MINERALS PO) Take 2 tablets by mouth daily.    . Calcium Carbonate-Vitamin D (CALTRATE 600+D PO) Take 1 tablet by mouth daily.     . Cholecalciferol (VITAMIN D3) 1000 UNITS CAPS Take 2,000 mg by mouth daily.    Marland Kitchen levothyroxine (LEVOXYL) 75 MCG tablet Take 75 mcg by mouth daily.    . metoprolol succinate (TOPROL-XL) 50 MG 24 hr tablet Take 50 mg by mouth daily. Take with or immediately following a meal.    . Multiple Vitamin (MULITIVITAMIN WITH MINERALS) TABS Take 1 tablet by mouth daily.      . Omega-3 Fatty Acids (FISH OIL) 1000 MG CAPS Take 2 capsules by mouth daily.    Earney Navy Bicarbonate (ZEGERID PO) Take 1 tablet by mouth daily.    . pantoprazole (PROTONIX) 40 MG tablet Take 1 tablet (40 mg total) by mouth daily. 90 tablet 3  . timolol (TIMOPTIC) 0.5 % ophthalmic solution Place 1 drop into both eyes daily.     . vitamin B-12 (CYANOCOBALAMIN) 1000 MCG tablet Take 1,000 mcg by mouth daily.     No current facility-administered medications for this visit.    Allergies:   Morphine and related; Codeine; and Oxycodone    Social History:  The patient  reports that she has never smoked. She has never used smokeless tobacco. She reports that she does not drink alcohol or use illicit drugs.   Family History:  The patient's family history includes Heart Problems in her mother. There is no history of Colon cancer.    ROS:  Please see the history of present illness.   Otherwise, review of systems are positive for none.   All other systems are reviewed and negative.    PHYSICAL EXAM: VS:  BP 112/70 mmHg  Pulse 94  Ht 5' (1.524 m)  Wt 160 lb 12.8 oz (72.938 kg)  BMI 31.40 kg/m2  SpO2 95% , BMI Body mass index is 31.4 kg/(m^2). GEN: Well nourished, well developed, in no acute distress HEENT: normal Neck: no JVD, carotid bruits, or masses Cardiac: RRR; no murmurs, rubs, or gallops,no edema  Respiratory:  clear to auscultation bilaterally, normal work of breathing GI: soft, nontender, nondistended, + BS MS: no deformity or atrophy Skin: warm and dry, no rash Neuro:  Strength and sensation are intact Psych: euthymic mood, full affect   EKG:  EKG is not ordered today.  EKG in EF 12/2014 showed normal LVF with no ST changes    Recent Labs: 01/01/2014: BUN 7; Creatinine 0.75; Hemoglobin 13.7; Platelets 282; Potassium 4.1; Sodium 143    Lipid Panel No results found for: CHOL, TRIG, HDL, CHOLHDL, VLDL, LDLCALC, LDLDIRECT    Wt Readings from Last 3 Encounters:   03/30/14 160 lb 12.8 oz (72.938 kg)  01/10/14 165 lb (74.844 kg)  01/09/14 165 lb 9.6 oz (75.116 kg)       ASSESSMENT AND PLAN:  1.  Atypical chest pain s/p EGD with 2 stomach ulcers now with CP resolved on PPI unless she eats chocolate or drinks caffeine.  I do not think her CP is cardiac related at this time.  She has a normal nuclear stress test in early 2013.  She does have CFR including post menopausal state, family history of CAD and HTN.  No further cardiac workup recommended at this time but I have asked her to let me know if she develops chest pain not related to eating or drinking caffienated products.    Current medicines are reviewed at length with the patient today.  The patient does not have concerns regarding medicines.  The following changes have been made:  no change  Labs/ tests ordered today include: Nond     Disposition:   FU with me PRN   Signed, Sueanne Margarita, MD  03/30/2014 3:39 PM    [facility] [place], [place], [place]  [id number] Phone: [phone]; Fax: [phone]

## 2014-04-06 ENCOUNTER — Encounter: Payer: Self-pay | Admitting: Gastroenterology

## 2014-04-06 ENCOUNTER — Ambulatory Visit (INDEPENDENT_AMBULATORY_CARE_PROVIDER_SITE_OTHER): Payer: 59 | Admitting: Gastroenterology

## 2014-04-06 VITALS — BP 140/72 | HR 64 | Ht 60.0 in | Wt 161.0 lb

## 2014-04-06 DIAGNOSIS — K219 Gastro-esophageal reflux disease without esophagitis: Secondary | ICD-10-CM

## 2014-04-06 DIAGNOSIS — K648 Other hemorrhoids: Secondary | ICD-10-CM

## 2014-04-06 NOTE — Progress Notes (Signed)
_                                                                                                                History of Present Illness:  Beth Sawyer has returned for evaluation of hemorrhoids.  She has symptomatic hemorrhoids consisting of very frequent limited rectal bleeding and intermittent pain.  Internal hemorrhoids were seen at colonoscopy in November an adenomatous polyp was removed.  She moves her bowels regularly.  He no longer has upper GI complaints.   Past Medical History  Diagnosis Date  . Thyroid disease   . Hypertension   . GERD (gastroesophageal reflux disease)   . Borderline diabetes   . Anxiety   . Arthritis   . Glaucoma   . PUD (peptic ulcer disease) 03/30/2014  . Benign essential HTN 03/30/2014   Past Surgical History  Procedure Laterality Date  . Dilation and curettage of uterus      X3  . Cesarean section  1971, 1975, 1978    x3  . Breast biopsy Right 1986  . Hysteroscopy w/d&c N/A 05/10/2012    Procedure: DILATATION AND CURETTAGE /HYSTEROSCOPY;  Surgeon: Gus Height, MD;  Location: [facility];  Service: Gynecology;  Laterality: N/A;  . Cataract extraction Right 2014  . Cataract extraction Left 02/28/2014   family history includes Heart Problems in her mother. There is no history of Colon cancer. Current Outpatient Prescriptions  Medication Sig Dispense Refill  . Calcium Carbonate-Vit D-Min (CALTRATE 600+D PLUS MINERALS PO) Take 2 tablets by mouth daily.    . Calcium Carbonate-Vitamin D (CALTRATE 600+D PO) Take 1 tablet by mouth daily.     . cetirizine (ZYRTEC) 10 MG tablet Take 10 mg by mouth daily. Pt takes generic form of Zyrtec    . Cholecalciferol (VITAMIN D3) 1000 UNITS CAPS Take 2,000 mg by mouth daily.    . fluconazole (DIFLUCAN) 150 MG tablet Take 1 dose today; repeat 7 days if not clear.    . levothyroxine (LEVOXYL) 75 MCG tablet Take 75 mcg by mouth daily.    . metoprolol succinate (TOPROL-XL) 50 MG 24 hr tablet Take 50 mg by mouth  daily. Take with or immediately following a meal.    . Multiple Vitamin (MULITIVITAMIN WITH MINERALS) TABS Take 1 tablet by mouth daily.    . Omega-3 Fatty Acids (FISH OIL) 1000 MG CAPS Take 2 capsules by mouth daily.    . pantoprazole (PROTONIX) 40 MG tablet Take 1 tablet (40 mg total) by mouth daily. 90 tablet 3  . timolol (TIMOPTIC) 0.5 % ophthalmic solution Place 1 drop into both eyes daily.     . vitamin B-12 (CYANOCOBALAMIN) 1000 MCG tablet Take 1,000 mcg by mouth daily.     No current facility-administered medications for this visit.   Allergies as of 04/06/2014 - Review Complete 04/06/2014  Allergen Reaction Noted  . Morphine and related Nausea And Vomiting 05/02/2012  . Codeine Nausea And Vomiting 04/16/2011  . Oxycodone Nausea And Vomiting 04/27/2012  . Tape Rash 04/06/2014  reports that she has never smoked. She has never used smokeless tobacco. She reports that she does not drink alcohol or use illicit drugs.   Review of Systems: Pertinent positive and negative review of systems were noted in the above HPI section. All other review of systems were otherwise negative.  Vital signs were reviewed in today's medical record Physical Exam: General: Well developed , well nourished, no acute distress Skin: anicteric Head: Normocephalic and atraumatic Eyes:  sclerae anicteric, EOMI Ears: Normal auditory acuity Mouth: No deformity or lesions Neck: Supple, no masses or thyromegaly Lungs: Clear throughout to auscultation Heart: Regular rate and rhythm; no murmurs, rubs or bruits Abdomen: Soft, non tender and non distended. No masses, hepatosplenomegaly or hernias noted. Normal Bowel sounds Rectal: Grade 3 hemorrhoids Musculoskeletal: Symmetrical with no gross deformities  Skin: No lesions on visible extremities Pulses:  Normal pulses noted Extremities: No clubbing, cyanosis, edema or deformities noted Neurological: Alert oriented x 4, grossly nonfocal Cervical Nodes:  No  significant cervical adenopathy Inguinal Nodes: No significant inguinal adenopathy Psychological:  Alert and cooperative. Normal mood and affect  See Assessment and Plan under Problem List

## 2014-04-06 NOTE — Assessment & Plan Note (Signed)
Asymptomatic on Protonix

## 2014-04-06 NOTE — Patient Instructions (Signed)
Your first banding is scheduled on 04/13/2014 at 10:15am

## 2014-04-06 NOTE — Assessment & Plan Note (Signed)
Symptomatic hemorrhoids consisting of pain and bleeding.  Patient has requested band ligation.

## 2014-04-13 ENCOUNTER — Encounter: Payer: Self-pay | Admitting: Gastroenterology

## 2014-04-13 ENCOUNTER — Ambulatory Visit (INDEPENDENT_AMBULATORY_CARE_PROVIDER_SITE_OTHER): Payer: 59 | Admitting: Gastroenterology

## 2014-04-13 VITALS — BP 132/74 | HR 62 | Resp 14 | Ht 60.0 in | Wt 160.6 lb

## 2014-04-13 DIAGNOSIS — K648 Other hemorrhoids: Secondary | ICD-10-CM

## 2014-04-13 NOTE — Progress Notes (Signed)
PROCEDURE NOTE: The patient presents with symptomatic grade *3**  hemorrhoids, requesting rubber band ligation of his/her hemorrhoidal disease.  All risks, benefits and alternative forms of therapy were described and informed consent was obtained.   The anorectum was pre-medicated with lubricant and nitroglycerine ointment The decision was made to band the **right posterior* internal hemorrhoid, and the [place] was used to perform band ligation without complication.  Digital anorectal examination was then performed to assure proper positioning of the band, and to adjust the banded tissue as required.  The patient was discharged home without pain or other issues.  Dietary and behavioral recommendations were given and along with follow-up instructions.    The patient will return in *2** weeks for  follow-up and possible additional banding as required. No complications were encountered and the patient tolerated the procedure well.

## 2014-04-13 NOTE — Patient Instructions (Addendum)
HEMORRHOID BANDING PROCEDURE    FOLLOW-UP CARE   1. The procedure you have had should have been relatively painless since the banding of the area involved does not have nerve endings and there is no pain sensation.  The rubber band cuts off the blood supply to the hemorrhoid and the band may fall off as soon as 48 hours after the banding (the band may occasionally be seen in the toilet bowl following a bowel movement). You may notice a temporary feeling of fullness in the rectum which should respond adequately to plain Tylenol or Motrin.  2. Following the banding, avoid strenuous exercise that evening and resume full activity the next day.  A sitz bath (soaking in a warm tub) or bidet is soothing, and can be useful for cleansing the area after bowel movements.     3. To avoid constipation, take two tablespoons of natural wheat bran, natural oat bran, flax, Benefiber or any over the counter fiber supplement and increase your water intake to 7-8 glasses daily.    4. Unless you have been prescribed anorectal medication, do not put anything inside your rectum for two weeks: No suppositories, enemas, fingers, etc.  5. Occasionally, you may have more bleeding than usual after the banding procedure.  This is often from the untreated hemorrhoids rather than the treated one.  Don't be concerned if there is a tablespoon or so of blood.  If there is more blood than this, lie flat with your bottom higher than your head and apply an ice pack to the area. If the bleeding does not stop within a half an hour or if you feel faint, call our office at [phone] or go to the emergency room.  6. Problems are not common; however, if there is a substantial amount of bleeding, severe pain, chills, fever or difficulty passing urine (very rare) or other problems, you should call us at [phone] or report to the nearest emergency room.  7. Do not stay seated continuously for more than 2-3 hours for a day or two  after the procedure.  Tighten your buttock muscles 10-15 times every two hours and take 10-15 deep breaths every 1-2 hours.  Do not spend more than a few minutes on the toilet if you cannot empty your bowel; instead re-visit the toilet at a later time.   Your second banding is scheduled on 05/25/2014 at 9:15am

## 2014-05-25 ENCOUNTER — Encounter: Payer: 59 | Admitting: Gastroenterology

## 2014-07-09 ENCOUNTER — Encounter: Payer: Self-pay | Admitting: Gastroenterology

## 2014-10-01 ENCOUNTER — Other Ambulatory Visit (HOSPITAL_COMMUNITY): Payer: Self-pay | Admitting: Physician Assistant

## 2014-10-01 DIAGNOSIS — Z1231 Encounter for screening mammogram for malignant neoplasm of breast: Secondary | ICD-10-CM

## 2014-10-26 ENCOUNTER — Ambulatory Visit (HOSPITAL_COMMUNITY)
Admission: RE | Admit: 2014-10-26 | Discharge: 2014-10-26 | Disposition: A | Discharge status: Home / Self Care | Payer: Medicare Other | Source: Ambulatory Visit | Attending: Physician Assistant | Admitting: Physician Assistant

## 2014-10-26 DIAGNOSIS — Z1231 Encounter for screening mammogram for malignant neoplasm of breast: Secondary | ICD-10-CM | POA: Diagnosis not present

## 2014-12-25 ENCOUNTER — Other Ambulatory Visit: Payer: Self-pay | Admitting: Gastroenterology

## 2014-12-28 ENCOUNTER — Encounter: Payer: Self-pay | Admitting: Gastroenterology

## 2014-12-28 ENCOUNTER — Ambulatory Visit (INDEPENDENT_AMBULATORY_CARE_PROVIDER_SITE_OTHER): Payer: Medicare Other | Admitting: Gastroenterology

## 2014-12-28 VITALS — BP 130/70 | HR 84 | Ht 60.0 in | Wt 162.6 lb

## 2014-12-28 DIAGNOSIS — K649 Unspecified hemorrhoids: Secondary | ICD-10-CM

## 2014-12-28 DIAGNOSIS — K219 Gastro-esophageal reflux disease without esophagitis: Secondary | ICD-10-CM

## 2014-12-28 MED ORDER — PANTOPRAZOLE SODIUM 40 MG PO TBEC: 40.0000 mg | DELAYED_RELEASE_TABLET | Freq: Every day | ORAL | Status: DC

## 2014-12-28 MED ORDER — HYDROCORTISONE 2.5 % RE CREA
1.0000 | TOPICAL_CREAM | Freq: Two times a day (BID) | RECTAL | Status: DC
Start: 2014-12-28 — End: 2017-04-01

## 2014-12-28 NOTE — Progress Notes (Signed)
Beth Sawyer    [id number]    1950-10-24  Primary Care Physician:SPENCER,SARA C, PA-C  Referring Physician: Aura Dials, PA-C [address] [place], [place] [address]  Chief complaint:  GERD, Hemorrhoids  HPI:   64 year old lady with history of GERD and hemorrhoids is here for follow-up.  she is on daily PPI with improvement in heartburn.  No  Dysphagia.  She had banding for hemorrhoids in February by Dr.Kaplan, had some pain in the rectum after and hasnt come become for repeat session for eradication. No bleeding. She has burning sensation intermittently. No diarrhea.    Outpatient Encounter Prescriptions as of 12/28/2014  Medication Sig  . Calcium Carbonate-Vit D-Min (CALTRATE 600+D PLUS MINERALS PO) Take 2 tablets by mouth daily.  . Calcium Carbonate-Vitamin D (CALTRATE 600+D PO) Take 1 tablet by mouth daily.   . cetirizine (ZYRTEC) 10 MG tablet Take 10 mg by mouth daily. Pt takes generic form of Zyrtec  . Cholecalciferol (VITAMIN D3) 1000 UNITS CAPS Take 2,000 mg by mouth daily.  . fluconazole (DIFLUCAN) 150 MG tablet Take 1 dose today; repeat 7 days if not clear.  . levothyroxine (LEVOXYL) 75 MCG tablet Take 75 mcg by mouth daily.  . metoprolol succinate (TOPROL-XL) 50 MG 24 hr tablet Take 50 mg by mouth daily. Take with or immediately following a meal.  . Multiple Vitamin (MULITIVITAMIN WITH MINERALS) TABS Take 1 tablet by mouth daily.  . Omega-3 Fatty Acids (FISH OIL) 1000 MG CAPS Take 2 capsules by mouth daily.  . pantoprazole (PROTONIX) 40 MG tablet Take 1 tablet (40 mg total) by mouth daily.  . timolol (TIMOPTIC) 0.5 % ophthalmic solution Place 1 drop into both eyes daily.   . vitamin B-12 (CYANOCOBALAMIN) 1000 MCG tablet Take 1,000 mcg by mouth daily.   No facility-administered encounter medications on file as of 12/28/2014.    Allergies as of 12/28/2014 - Review Complete 04/13/2014  Allergen Reaction Noted  . Morphine and related Nausea And  Vomiting 05/02/2012  . Codeine Nausea And Vomiting 04/16/2011  . Oxycodone Nausea And Vomiting 04/27/2012  . Tape Rash 04/06/2014    Past Medical History  Diagnosis Date  . Thyroid disease   . Hypertension   . GERD (gastroesophageal reflux disease)   . Borderline diabetes   . Anxiety   . Arthritis   . Glaucoma   . PUD (peptic ulcer disease) 03/30/2014  . Benign essential HTN 03/30/2014    Past Surgical History  Procedure Laterality Date  . Dilation and curettage of uterus      X3  . Cesarean section  1971, 1975, 1978    x3  . Breast biopsy Right 1986  . Hysteroscopy w/d&c N/A 05/10/2012    Procedure: DILATATION AND CURETTAGE /HYSTEROSCOPY;  Surgeon: Gus Height, MD;  Location: [facility];  Service: Gynecology;  Laterality: N/A;  . Cataract extraction Right 2014  . Cataract extraction Left 02/28/2014    Family History  Problem Relation Age of Onset  . Colon cancer Neg Hx   . Heart Problems Mother     Social History   Social History  . Marital Status: Widowed    Spouse Name: N/A  . Number of Children: N/A  . Years of Education: N/A   Occupational History  . Not on file.   Social History Main Topics  . Smoking status: Never Smoker   . Smokeless tobacco: Never Used  . Alcohol Use: No  . Drug  Use: No  . Sexual Activity: Not on file   Other Topics Concern  . Not on file   Social History Narrative      Review of systems: Review of Systems  Constitutional: Negative for fever and chills.  HENT: Negative.   Eyes: Negative for blurred vision.  Respiratory: Negative for cough, shortness of breath and wheezing.   Cardiovascular: Negative for chest pain and palpitations.  Gastrointestinal: as per HPI Genitourinary: Negative for dysuria, urgency, frequency and hematuria.  Musculoskeletal: Negative for myalgias, back pain and joint pain.  Skin: Negative for itching and rash.  Neurological: Negative for dizziness, tremors, focal weakness, seizures and loss of  consciousness.  Endo/Heme/Allergies: Negative for environmental allergies.  Psychiatric/Behavioral: Negative for depression, suicidal ideas and hallucinations.  All other systems reviewed and are negative.   Physical Exam: There were no vitals filed for this visit. Gen:      No acute distress HEENT:  EOMI, sclera anicteric Neck:     No masses; no thyromegaly Lungs:    Clear to auscultation bilaterally; normal respiratory effort CV:         Regular rate and rhythm; no murmurs Abd:      + bowel sounds; soft, non-tender; no palpable masses, no distension Ext:    No edema; adequate peripheral perfusion Skin:      Warm and dry; no rash Neuro: alert and oriented x 3 Psych: normal mood and affect Rectal exam: No external hemorrhoids or fissure Data Reviewed:   EGD 2015 1. Two small ulcers were found on the anterior wall of the gastric body; biopsies were taken 2. EGD was otherwise normal 3. Duodenal inflammation was found in the duodenal bulb  Colonoscopy 2015 1. Flat polyp measuring 2-3 mm in size was found at the cecum; polypectomy was performed with a cold snare and with cold forceps 2. Moderate diverticulosis was noted in the sigmoid colon 3. Internal hemorrhoids  Surgical [P], cecum, polyp - TUBULAR ADENOMA. NO HIGH GRADE DYSPLASIA OR MALIGNANCY IDENTIFIED.   Assessment and Plan/Recommendations: 54 yr F with h/o GERD and internal hemorrhoids s/p banding in Feb 2016 here for follow up visit GERD: continue Protonix Anti reflux measures Hemorrhoids: no active bleeding, can use anusol as needed No further banding Recall colonoscopy in 2020 Return as needed  K. Denzil Magnuson , MD [phone] Mon-Fri 8a-5p [phone] after 5p, weekends, holidays

## 2014-12-28 NOTE — Patient Instructions (Signed)
We will send your prescriptions to your pharmacy  Follow up in one year

## 2015-01-07 ENCOUNTER — Telehealth: Payer: Self-pay

## 2015-01-07 NOTE — Telephone Encounter (Signed)
Patient had inquired at her last visit here about more hemorrhoid banding but wanted to make sure her new insurance would cover it.  I called and left a detailed message with the CPT code : [phone] and told her to call us back with any questions.

## 2015-03-18 ENCOUNTER — Other Ambulatory Visit: Payer: Self-pay

## 2015-03-18 ENCOUNTER — Telehealth: Payer: Self-pay | Admitting: Gastroenterology

## 2015-03-18 MED ORDER — DEXLANSOPRAZOLE 30 MG PO CPDR: 30.0000 mg | DELAYED_RELEASE_CAPSULE | Freq: Every day | ORAL | Status: DC

## 2015-03-18 NOTE — Telephone Encounter (Signed)
She takes her Protonix first thing in the morning as directed. Her symptoms of reflux started after Christmas. She has burping and refluxing without regard to eating. This can happen first thing in the morning on an empty stomach also. Her chest pain goes through to her back and can happen in the middle of the night. Please advise.

## 2015-03-18 NOTE — Telephone Encounter (Signed)
Please advise patient to stop Protonix and Send Rx for Dexilant 30mg  daily before breakfast. If not covered by insurance will have to consider Lansoprazole 30mg  twice daily  or Nexium 40 mg twice daily 30 minutes before breakfast and dinner. She will need to follow all antireflux measures no meals 3 hours before bedtime and sleep with head end elevation

## 2015-03-18 NOTE — Telephone Encounter (Signed)
Patient advised of the plan. Dexilant sent to the pharmacy. Will check back to see if a PA is needed.

## 2015-03-19 ENCOUNTER — Other Ambulatory Visit: Payer: Self-pay

## 2015-03-19 MED ORDER — LANSOPRAZOLE 30 MG PO CPDR: 30.0000 mg | DELAYED_RELEASE_CAPSULE | Freq: Every day | ORAL | Status: AC

## 2015-03-19 NOTE — Telephone Encounter (Signed)
ok 

## 2015-03-19 NOTE — Telephone Encounter (Signed)
I have left message for the patient to call back 

## 2015-03-19 NOTE — Telephone Encounter (Signed)
Dexilant will cost over $200.00 after insurance pays their part. Okay to change to Lansoprazole 30 mg daily?

## 2015-03-20 NOTE — Telephone Encounter (Signed)
I left a maessage to check with the pharmacy for the new rx and to let me know if there are any issues.

## 2015-04-17 ENCOUNTER — Telehealth: Payer: Self-pay | Admitting: Gastroenterology

## 2015-04-17 NOTE — Telephone Encounter (Signed)
Prior auth done today through cover my meds

## 2015-04-17 NOTE — Telephone Encounter (Signed)
Called patient and explained we have to do a prior auth with her insurance. She stated if its not approved she will take Protonix again because it helped her some.

## 2015-04-18 MED ORDER — PANTOPRAZOLE SODIUM 40 MG PO TBEC: 40.0000 mg | DELAYED_RELEASE_TABLET | Freq: Every day | ORAL | Status: DC

## 2015-04-18 NOTE — Telephone Encounter (Signed)
Patient was denied for prevacid must have tried omeprazole first.  Patient wanted to be put on protonix again .Marland Kitchen So sent in protonix to her pharmacy

## 2015-10-11 ENCOUNTER — Other Ambulatory Visit: Payer: Self-pay | Admitting: Physician Assistant

## 2015-10-11 DIAGNOSIS — Z1231 Encounter for screening mammogram for malignant neoplasm of breast: Secondary | ICD-10-CM

## 2015-11-15 ENCOUNTER — Ambulatory Visit: Payer: Medicare Other

## 2015-11-18 ENCOUNTER — Ambulatory Visit
Admission: RE | Admit: 2015-11-18 | Discharge: 2015-11-18 | Disposition: A | Discharge status: Home / Self Care | Payer: Medicare Other | Source: Ambulatory Visit | Attending: Physician Assistant | Admitting: Physician Assistant

## 2015-11-18 DIAGNOSIS — Z1231 Encounter for screening mammogram for malignant neoplasm of breast: Secondary | ICD-10-CM

## 2016-01-01 ENCOUNTER — Encounter: Payer: Self-pay | Admitting: Pulmonary Disease

## 2016-01-01 ENCOUNTER — Ambulatory Visit (INDEPENDENT_AMBULATORY_CARE_PROVIDER_SITE_OTHER): Payer: Medicare Other | Admitting: Pulmonary Disease

## 2016-01-01 VITALS — BP 138/64 | HR 60 | Ht 61.0 in | Wt 154.0 lb

## 2016-01-01 DIAGNOSIS — R0683 Snoring: Secondary | ICD-10-CM

## 2016-01-01 NOTE — Progress Notes (Signed)
   Subjective:    Patient ID: Beth Sawyer, female    DOB: 1950/03/14, 65 y.o.   MRN: [id number]  HPI    Review of Systems  Constitutional: Positive for unexpected weight change. Negative for fever.  HENT: Positive for sneezing. Negative for congestion, dental problem, ear pain, nosebleeds, postnasal drip, rhinorrhea, sinus pressure, sore throat and trouble swallowing.   Eyes: Negative for redness and itching.  Respiratory: Negative for cough, chest tightness, shortness of breath and wheezing.   Cardiovascular: Positive for chest pain ( x 1 year off and on). Negative for palpitations and leg swelling.  Gastrointestinal: Negative for nausea and vomiting.  Genitourinary: Negative for dysuria.  Musculoskeletal: Positive for arthralgias. Negative for joint swelling.  Skin: Positive for rash.  Neurological: Negative for headaches.  Hematological: Does not bruise/bleed easily.  Psychiatric/Behavioral: Negative for dysphoric mood. The patient is nervous/anxious.        Objective:   Physical Exam        Assessment & Plan:

## 2016-01-01 NOTE — Patient Instructions (Signed)
Will arrange for in lab sleep study Will call to arrange for follow up after sleep study reviewed  

## 2016-01-01 NOTE — Progress Notes (Signed)
Past Surgical History She  has a past surgical history that includes Dilation and curettage of uterus; Cesarean section (1971, 1975, 1978); Breast biopsy (Right, 1986); Hysteroscopy w/D&C (N/A, 05/10/2012); Cataract extraction (Right, 2014); Cataract extraction (Left, 02/28/2014); and Hemorrhoid banding.  Allergies  Allergen Reactions  . Morphine And Related Nausea And Vomiting  . Codeine Nausea And Vomiting    "made me real hot"  . Oxycodone Nausea And Vomiting  . Tramadol Nausea And Vomiting  . Tape Rash    Family History Her family history includes Asthma in her sister; Brain cancer in her sister; Breast cancer in her sister; COPD in her sister; Congestive Heart Failure in her sister; Emphysema in her sister; Heart Problems in her mother; Heart disease in her brother and sister; Pancreatic cancer in her sister.  Social History She  reports that she has never smoked. She has never used smokeless tobacco. She reports that she does not drink alcohol or use drugs.  Review of systems Chest pain, reflux, sneezing, anxiety, joint pain, rash, weight change.  Current Outpatient Prescriptions on File Prior to Visit  Medication Sig  . Calcium Carbonate-Vitamin D (CALTRATE 600+D PO) Take 1 tablet by mouth daily.   . cetirizine (ZYRTEC) 10 MG tablet Take 10 mg by mouth daily. Pt takes generic form of Zyrtec  . Cholecalciferol (VITAMIN D3) 1000 UNITS CAPS Take 2,000 mg by mouth daily.  . hydrocortisone (ANUSOL-HC) 2.5 % rectal cream Place 1 application rectally 2 (two) times daily.  . lansoprazole (PREVACID) 30 MG capsule Take 1 capsule (30 mg total) by mouth daily.  Marland Kitchen levothyroxine (LEVOXYL) 75 MCG tablet Take 75 mcg by mouth daily.  . metoprolol succinate (TOPROL-XL) 50 MG 24 hr tablet Take 50 mg by mouth daily. Take with or immediately following a meal.  . Multiple Vitamin (MULITIVITAMIN WITH MINERALS) TABS Take 1 tablet by mouth daily.  . Omega-3 Fatty Acids (FISH OIL) 1000 MG CAPS Take 2  capsules by mouth daily.  . pantoprazole (PROTONIX) 40 MG tablet Take 1 tablet (40 mg total) by mouth daily.  . vitamin B-12 (CYANOCOBALAMIN) 1000 MCG tablet Take 1,000 mcg by mouth daily.   No current facility-administered medications on file prior to visit.     Chief Complaint  Patient presents with  . SLEEP CONSULT    Referred by Dr Anastasia Pall. Epworth Score: 3    Past medical history She  has a past medical history of Anxiety; Arthritis; Benign essential HTN (03/30/2014); Borderline diabetes; GERD (gastroesophageal reflux disease); Glaucoma; Hypertension; PUD (peptic ulcer disease) (03/30/2014); and Thyroid disease.  Vital signs BP 138/64 (BP Location: Left Arm, Cuff Size: Normal)   Pulse 60   Ht 5\' 1"  (1.549 m)   Wt 154 lb (69.9 kg)   SpO2 93%   BMI 29.10 kg/m   History of Present Illness Beth Sawyer is a 65 y.o. female for evaluation of sleep problems.  She was seen recently in PCP office.  She mentioned that she has trouble with her sleep.  She wakes up frequently and then has trouble falling back to sleep.  She is usually in the middle of a dream and then wakes up hearing herself snore.  She has trouble sleeping on her back.  Her mouth gets dry at night.  She will occasionally get leg cramps at night.    She goes to sleep at 11 pm.  She falls asleep after few minutes.  She wakes up some times to use the bathroom.  She gets  out of bed at 645 am.  She feels tired sometimes in the morning.  She sometimes gets morning headaches.  She does not use anything to help her fall sleep or stay awake.  She denies sleep walking, sleep talking, bruxism, or nightmares.  There is no history of restless legs.  She denies sleep hallucinations, sleep paralysis, or cataplexy.  The Epworth score is 3 out of 24.   Physical Exam:  General - No distress ENT - No sinus tenderness, no oral exudate, no LAN, no thyromegaly, TM clear, pupils equal/reactive, MP 3 Cardiac - s1s2 regular, no  murmur, pulses symmetric Chest - No wheeze/rales/dullness, good air entry, normal respiratory excursion Back - No focal tenderness Abd - Soft, non-tender, no organomegaly, + bowel sounds Ext - No edema Neuro - Normal strength, cranial nerves intact Skin - No rashes Psych - Normal mood, and behavior  Discussion: She has snoring, sleep disruption and daytime sleepiness.  She has hx of hypertension and anxiety.  I am concerned she could have sleep apnea.  We discussed how sleep apnea can affect various health problems, including risks for hypertension, cardiovascular disease, and diabetes.  We also discussed how sleep disruption can increase risks for accidents, such as while driving.  Weight loss as a means of improving sleep apnea was also reviewed.  Additional treatment options discussed were CPAP therapy, oral appliance, and surgical intervention.   Assessment/plan:  Snoring with concern for sleep apnea. - will arrange for in lab sleep study   Patient Instructions  Will arrange for in lab sleep study Will call to arrange for follow up after sleep study reviewed     Chesley Mires, M.D. Pager [phone] 01/01/2016, 12:01 PM

## 2016-01-03 ENCOUNTER — Telehealth: Payer: Self-pay | Admitting: Gastroenterology

## 2016-01-03 MED ORDER — PANTOPRAZOLE SODIUM 40 MG PO TBEC: 40.0000 mg | DELAYED_RELEASE_TABLET | Freq: Every day | ORAL | 3 refills | Status: DC

## 2016-01-03 NOTE — Telephone Encounter (Signed)
Pantoprazole sent in electronically

## 2016-02-21 ENCOUNTER — Ambulatory Visit (HOSPITAL_BASED_OUTPATIENT_CLINIC_OR_DEPARTMENT_OTHER): Payer: Medicare Other | Attending: Pulmonary Disease | Admitting: Pulmonary Disease

## 2016-02-21 VITALS — Ht 60.0 in | Wt 154.0 lb

## 2016-02-21 DIAGNOSIS — R0683 Snoring: Secondary | ICD-10-CM | POA: Insufficient documentation

## 2016-02-21 DIAGNOSIS — R51 Headache: Secondary | ICD-10-CM | POA: Insufficient documentation

## 2016-02-21 DIAGNOSIS — G4733 Obstructive sleep apnea (adult) (pediatric): Secondary | ICD-10-CM | POA: Diagnosis not present

## 2016-02-25 ENCOUNTER — Other Ambulatory Visit (HOSPITAL_BASED_OUTPATIENT_CLINIC_OR_DEPARTMENT_OTHER): Payer: Self-pay

## 2016-02-25 DIAGNOSIS — R0683 Snoring: Secondary | ICD-10-CM

## 2016-02-28 ENCOUNTER — Telehealth: Payer: Self-pay | Admitting: Pulmonary Disease

## 2016-02-28 ENCOUNTER — Encounter (HOSPITAL_BASED_OUTPATIENT_CLINIC_OR_DEPARTMENT_OTHER): Payer: Self-pay | Admitting: Pulmonary Disease

## 2016-02-28 DIAGNOSIS — G4733 Obstructive sleep apnea (adult) (pediatric): Secondary | ICD-10-CM

## 2016-02-28 HISTORY — DX: Obstructive sleep apnea (adult) (pediatric): G47.33

## 2016-02-28 NOTE — Telephone Encounter (Signed)
PSG 02/21/16 >> AHI 13.2, SaO2 low 83%  Will have my nurse inform pt that sleep study shows mild sleep apnea.  Options are 1) CPAP now, 2) ROV first.  If She is agreeable to CPAP, then please send order for auto CPAP range 5 to 15 cm H2O with heated humidity and mask of choice.  Have download sent 1 month after starting CPAP and set up ROV 2 months after starting CPAP.  ROV can be with me or NP.

## 2016-02-28 NOTE — Procedures (Signed)
   Patient Name: Beth Sawyer, Beth Sawyer Date: 02/21/2016 Gender: Female D.O.B: May 10, 1950 Age (years): 81 Referring Provider: Chesley Mires MD, ABSM Height (inches): 60 Interpreting Physician: Chesley Mires MD, ABSM Weight (lbs): 154 RPSGT: Jonna Coup BMI: 30 MRN: [id number] Neck Size: 15.00  CLINICAL INFORMATION Sleep Study Type: NPSG  Indication for sleep study: Morning Headaches, Snoring  Epworth Sleepiness Score: 8  SLEEP STUDY TECHNIQUE As per the AASM Manual for the Scoring of Sleep and Associated Events v2.3 (April 2016) with a hypopnea requiring 4% desaturations.  The channels recorded and monitored were frontal, central and occipital EEG, electrooculogram (EOG), submentalis EMG (chin), nasal and oral airflow, thoracic and abdominal wall motion, anterior tibialis EMG, snore microphone, electrocardiogram, and pulse oximetry.  MEDICATIONS Medications self-administered by patient taken the night of the study : N/A  SLEEP ARCHITECTURE The study was initiated at 10:59:49 PM and ended at 5:00:27 AM.  Sleep onset time was 5.4 minutes and the sleep efficiency was 78.1%. The total sleep time was 281.7 minutes.  Stage REM latency was 265.0 minutes.  The patient spent 3.90% of the night in stage N1 sleep, 89.17% in stage N2 sleep, 0.00% in stage N3 and 6.92% in REM.  Alpha intrusion was absent.  Supine sleep was 60.52%.  RESPIRATORY PARAMETERS The overall apnea/hypopnea index (AHI) was 13.2 per hour. There were 0 total apneas, including 0 obstructive, 0 central and 0 mixed apneas. There were 62 hypopneas and 3 RERAs.  The AHI during Stage REM sleep was 52.3 per hour.  AHI while supine was 21.1 per hour.  The mean oxygen saturation was 92.60%. The minimum SpO2 during sleep was 83.00%.  Moderate snoring was noted during this study.  CARDIAC DATA The 2 lead EKG demonstrated sinus rhythm. The mean heart rate was 63.54 beats per minute. Other EKG findings include:  None.  LEG MOVEMENT DATA The total PLMS were 3 with a resulting PLMS index of 0.64. Associated arousal with leg movement index was 0.9 .  IMPRESSIONS - Mild obstructive sleep apnea occurred during this study (AHI = 13.2/h). - Mild oxygen desaturation was noted during this study (Min O2 = 83.00%).  DIAGNOSIS - Obstructive Sleep Apnea (327.23 [G47.33 ICD-10]).  RECOMMENDATIONS - Additional therapies include weight loss, CPAP, oral appliance, and surgical assessment.  [Electronically signed] 02/28/2016 02:06 PM  Chesley Mires MD, [place], American Board of Sleep Medicine   NPI: [id number]

## 2016-03-06 ENCOUNTER — Ambulatory Visit: Payer: Medicare Other | Admitting: Gastroenterology

## 2016-03-09 ENCOUNTER — Encounter: Payer: Self-pay | Admitting: Gastroenterology

## 2016-03-09 ENCOUNTER — Ambulatory Visit (INDEPENDENT_AMBULATORY_CARE_PROVIDER_SITE_OTHER): Payer: Medicare HMO | Admitting: Gastroenterology

## 2016-03-09 VITALS — BP 120/74 | HR 57 | Ht 61.0 in | Wt 155.0 lb

## 2016-03-09 DIAGNOSIS — R140 Abdominal distension (gaseous): Secondary | ICD-10-CM | POA: Diagnosis not present

## 2016-03-09 DIAGNOSIS — Z86010 Personal history of colonic polyps: Secondary | ICD-10-CM

## 2016-03-09 DIAGNOSIS — K219 Gastro-esophageal reflux disease without esophagitis: Secondary | ICD-10-CM

## 2016-03-09 DIAGNOSIS — R142 Eructation: Secondary | ICD-10-CM

## 2016-03-09 DIAGNOSIS — E739 Lactose intolerance, unspecified: Secondary | ICD-10-CM | POA: Diagnosis not present

## 2016-03-09 MED ORDER — PANTOPRAZOLE SODIUM 40 MG PO TBEC: 40.0000 mg | DELAYED_RELEASE_TABLET | Freq: Every day | ORAL | 3 refills | Status: DC

## 2016-03-09 NOTE — Progress Notes (Signed)
Beth Sawyer    [id number]    10-25-1950  Primary Care Physician:SPENCER,SARA C, PA-C  Referring Physician: Aura Dials, PA-C [place], [place] [address]  Chief complaint:  GERD, bloating  HPI: 66 year old female with history of chronic GERD here for follow-up visit. She was previously followed by Dr. Deatra Sawyer and was last seen in office 04/06/2014. She is here for medication refills. Reports her GERD symptoms are stable with occasional breakthrough heartburn and regurgitation. She has excessive bloating and belching worse postprandial. She feels bloating is worse after she eats beans, broccoli. She also drinks milk daily and eats cheese on regular basis and does think she has excessive belching subsequently on further questioning. Denies any nausea, vomiting, abdominal pain, melena or bright red blood per rectum    Outpatient Encounter Prescriptions as of 03/09/2016  Medication Sig  . Calcium Carbonate-Vitamin D (CALTRATE 600+D PO) Take 1 tablet by mouth daily.   . cetirizine (ZYRTEC) 10 MG tablet Take 10 mg by mouth daily. Pt takes generic form of Zyrtec  . Cholecalciferol (VITAMIN D3) 1000 UNITS CAPS Take 2,000 mg by mouth daily.  Marland Kitchen FLUoxetine (PROZAC) 10 MG capsule Take 10 mg by mouth daily.  . hydrocortisone (ANUSOL-HC) 2.5 % rectal cream Place 1 application rectally 2 (two) times daily.  . lansoprazole (PREVACID) 30 MG capsule Take 1 capsule (30 mg total) by mouth daily.  Marland Kitchen levothyroxine (LEVOXYL) 75 MCG tablet Take 75 mcg by mouth daily.  . metoprolol succinate (TOPROL-XL) 50 MG 24 hr tablet Take 50 mg by mouth daily. Take with or immediately following a meal.  . Multiple Vitamin (MULITIVITAMIN WITH MINERALS) TABS Take 1 tablet by mouth daily.  . Omega-3 Fatty Acids (FISH OIL) 1000 MG CAPS Take 2 capsules by mouth daily.  . pantoprazole (PROTONIX) 40 MG tablet Take 1 tablet (40 mg total) by mouth daily.  . rosuvastatin (CRESTOR) 5 MG tablet Take 5  mg by mouth daily.  . timolol (TIMOPTIC) 0.5 % ophthalmic solution Place 1 drop into both eyes daily.  . vitamin B-12 (CYANOCOBALAMIN) 1000 MCG tablet Take 1,000 mcg by mouth daily.  . [DISCONTINUED] pantoprazole (PROTONIX) 40 MG tablet Take 1 tablet (40 mg total) by mouth daily.   No facility-administered encounter medications on file as of 03/09/2016.     Allergies as of 03/09/2016 - Review Complete 01/01/2016  Allergen Reaction Noted  . Morphine and related Nausea And Vomiting 05/02/2012  . Codeine Nausea And Vomiting 04/16/2011  . Oxycodone Nausea And Vomiting 04/27/2012  . Tramadol Nausea And Vomiting 12/28/2014  . Tape Rash 04/06/2014    Past Medical History:  Diagnosis Date  . Anxiety   . Arthritis   . Benign essential HTN 03/30/2014  . Borderline diabetes    diet controlled  . GERD (gastroesophageal reflux disease)   . Glaucoma   . Hypertension   . OSA (obstructive sleep apnea) 02/28/2016  . PUD (peptic ulcer disease) 03/30/2014  . Thyroid disease     Past Surgical History:  Procedure Laterality Date  . BREAST BIOPSY Right 1986  . CATARACT EXTRACTION Right 2014  . CATARACT EXTRACTION Left 02/28/2014  . [place]   x3  . DILATION AND CURETTAGE OF UTERUS     X3  . HEMORRHOID BANDING    . HYSTEROSCOPY W/D&C N/A 05/10/2012   Procedure: DILATATION AND CURETTAGE /HYSTEROSCOPY;  Surgeon: Gus Height, MD;  Location: [facility];  Service:  Gynecology;  Laterality: N/A;    Family History  Problem Relation Age of Onset  . Heart Problems Mother   . Emphysema Sister   . Asthma Sister   . COPD Sister   . Heart disease Brother   . Heart disease Sister   . Congestive Heart Failure Sister   . Breast cancer Sister   . Brain cancer Sister   . Pancreatic cancer Sister   . Colon cancer Neg Hx     Social History   Social History  . Marital status: Widowed    Spouse name: N/A  . Number of children: 3  . Years of education: N/A   Occupational History  .  caregiver    Social History Main Topics  . Smoking status: Never Smoker  . Smokeless tobacco: Never Used  . Alcohol use No     Comment: social/rare  . Drug use: No  . Sexual activity: Not on file   Other Topics Concern  . Not on file   Social History Narrative  . No narrative on file      Review of systems: Review of Systems  Constitutional: Negative for fever and chills.  HENT: Positive for sinus problem.   Eyes: Negative for blurred vision.  Respiratory: Negative for cough, shortness of breath and wheezing.   Cardiovascular: Negative for chest pain and palpitations.  Gastrointestinal: as per HPI Genitourinary: Negative for dysuria, urgency, frequency and hematuria.  Musculoskeletal: Positive for myalgias, back pain and joint pain.  Skin: Negative for itching and rash.  Neurological: Negative for dizziness, tremors, focal weakness, seizures and loss of consciousness. Positive for headaches Endo/Heme/Allergies: Positive for seasonal allergies.  Psychiatric/Behavioral: Negative for depression, suicidal ideas and hallucinations.  All other systems reviewed and are negative.   Physical Exam: Vitals:   03/09/16 1055  BP: 120/74  Pulse: (!) 57   Body mass index is 29.29 kg/m. Gen:      No acute distress HEENT:  EOMI, sclera anicteric Neck:     No masses; no thyromegaly Lungs:    Clear to auscultation bilaterally; normal respiratory effort CV:         Regular rate and rhythm; no murmurs Abd:      + bowel sounds; soft, non-tender; no palpable masses, no distension Ext:    No edema; adequate peripheral perfusion Skin:      Warm and dry; no rash Neuro: alert and oriented x 3 Psych: normal mood and affect  Data Reviewed:  Reviewed labs, radiology imaging, old records and pertinent past GI work up  EGD November 2015: 2 small gastric ulcers and duodenitis  Colonoscopy April 2015: Small tubular adenoma removed and small internal hemorrhoids  Assessment and  Plan/Recommendations: 66 year old female with chronic GERD here for follow-up  GERD: stable with minimal breakthrough symptoms Continue Protonix daily 30 minutes before breakfast Refill sent to pharmacy Antireflux measures  Bloating and excessive belching: Could be secondary to lactose intolerance Advised patient to do a trial of lactose-free diet for 2 weeks Avoid excessive fiber Okay to use Phazyme 1-2 capsules, 3 times daily as needed  Due for recall surveillance colonoscopy in April 2020  Return as needed  25 minutes was spent face-to-face with the patient. Greater than 50% of the time used for counseling as well as treatment plan and follow-up. She had multiple questions which were answered to her satisfaction  K. Denzil Magnuson , MD [phone] Mon-Fri 8a-5p [phone] after 5p, weekends, holidays  CC: Aura Dials, [place]

## 2016-03-09 NOTE — Telephone Encounter (Signed)
Spoke with pt, aware of recs.  cpap ordered.  Pt will call back after getting cpap to schedule rov.  Nothing further needed.

## 2016-03-09 NOTE — Patient Instructions (Signed)
Lactose-Free Diet, Adult Introduction If you have lactose intolerance, you are not able to digest lactose. Lactose is a natural sugar found mainly in milk and milk products. You may need to avoid all foods and beverages that contain lactose. A lactose-free diet can help you do this. What do I need to know about this diet?  Do not consume foods, beverages, vitamins, minerals, or medicines with lactose. Read ingredients lists carefully.  Look for the words "lactose-free" on labels.  Use lactase enzyme drops or tablets as directed by your health care provider.  Use lactose-free milk or a milk alternative, such as soy milk, for drinking and cooking.  Make sure you get enough calcium and vitamin D in your diet. A lactose-free eating plan can be lacking in these important nutrients.  Take calcium and vitamin D supplements as directed by your health care provider. Talk to your provider about supplements if you are not able to get enough calcium and vitamin D from food. Which foods have lactose? Lactose is found in:  Milk and foods made from milk.  Yogurt.  Cheese.  Butter.  Margarine.  Sour cream.  Cream.  Whipped toppings and nondairy creamers.  Ice cream and other milk-based desserts. Lactose is also found in foods or products made with milk or milk ingredients. To find out whether a food contains milk or a milk ingredient, look at the ingredients list. Avoid foods with the statement "May contain milk" and foods that contain:  Butter.  Cream.  Milk.  Milk solids.  Milk powder.  Whey.  Curd.  Caseinate.  Lactose.  Lactalbumin.  Lactoglobulin. What are some alternatives to milk and foods made with milk products?  Lactose-free milk.  Soy milk with added calcium and vitamin D.  Almond, coconut, or rice milk with added calcium and vitamin D. Note that these are low in protein.  Soy products, such as soy yogurt, soy cheese, soy ice cream, and soy-based sour  cream. Which foods can I eat? Grains  Breads and rolls made without milk, such as [place], [place], or [place] bread, bagels, pita, and [employer]. Corn tortillas, corn meal, grits, and polenta. Crackers without lactose or milk solids, such as soda crackers and graham crackers. Cooked or dry cereals without lactose or milk solids. Pasta, quinoa, couscous, barley, oats, bulgur, farro, rice, wild rice, or other grains prepared without milk or lactose. Plain popcorn. Vegetables  Fresh, frozen, and canned vegetables without cheese, cream, or butter sauces. Fruits  All fresh, canned, frozen, or dried fruits that are not processed with lactose. Meats and Other Protein Sources  Plain beef, chicken, fish, [place], lamb, veal, pork, wild game, or ham. Kosher-prepared meat products. Strained or junior meats that do not contain milk. Eggs. Soy meat substitutes. Beans, lentils, and hummus. Tofu. Nuts and seeds. Peanut or other nut butters without lactose. Soups, casseroles, and mixed dishes without cheese, cream, or milk. Dairy  Lactose-free milk. Soy, rice, or almond milk with added calcium and vitamin D. Soy cheese and yogurt. Beverages  Carbonated drinks. Tea. Coffee, freeze-dried coffee, and some instant coffees. Fruit and vegetable juices. Condiments  Soy sauce. Carob powder. Olives. Gravy made with water. Baker's cocoa. Angie Fava. Pure seasonings and spices. Ketchup. Mustard. Bouillon. Broth. Sweets and Desserts  Water and fruit ices. Gelatin. Cookies, pies, or cakes made from allowed ingredients, such as angel food cake. Pudding made with water or a milk substitute. Lactose-free tofu desserts. Soy, coconut milk, or rice-milk-based frozen desserts. Sugar. Honey. Jam, jelly, and marmalade.  Molasses. Pure sugar candy. Dark chocolate without milk. Marshmallows. Fats and Oils  Margarines and salad dressings that do not contain milk. Berniece Salines. Vegetable oils. Shortening. Mayonnaise. Soy or coconut-based cream. The items  listed above may not be a complete list of recommended foods or beverages. Contact your dietitian for more options.  Which foods are not recommended? Grains  Breads and rolls that contain milk. Toaster pastries. Muffins, biscuits, waffles, cornbread, and pancakes. These can be prepared at home, commercial, or from mixes. Sweet rolls, donuts, English muffins, fry bread, lefse, flour tortillas with lactose, or [place] toast made with milk or milk ingredients. Crackers that contain lactose. Corn curls. Cooked or dry cereals with lactose. Vegetables  Creamed or breaded vegetables. Vegetables in a cheese or butter sauce or with lactose-containing margarines. Instant potatoes. [place] fries. Scalloped or au gratin potatoes. Fruits  None. Meats and Other Protein Sources  Scrambled eggs, omelets, and souffles that contain milk. Creamed or breaded meat, fish, chicken, or [place]. Sausage products, such as wieners and liver sausage. Cold cuts that contain milk solids. Cheese, cottage cheese, ricotta cheese, and cheese spreads. Lasagna and macaroni and cheese. Pizza. Peanut or other nut butters with added milk solids. Casseroles or mixed dishes containing milk or cheese. Dairy  All dairy products, including milk, goat's milk, buttermilk, kefir, acidophilus milk, flavored milk, evaporated milk, condensed milk, dulce de [place], eggnog, yogurt, cheese, and cheese spreads. Beverages  Hot chocolate. Cocoa with lactose. Instant iced teas. Powdered fruit drinks. Smoothies made with milk or yogurt. Condiments  Chewing gum that has lactose. Cocoa that has lactose. Spice blends if they contain milk products. Artificial sweeteners that contain lactose. Nondairy creamers. Sweets and Desserts  Ice cream, ice milk, gelato, sherbet, and frozen yogurt. Custard, pudding, and mousse. Cake, cream pies, cookies, and other desserts containing milk, cream, cream cheese, or milk chocolate. Pie crust made with milk-containing margarine  or butter. Reduced-calorie desserts made with a sugar substitute that contains lactose. Toffee and butterscotch. Milk, white, or dark chocolate that contains milk. Fudge. Caramel. Fats and Oils  Margarines and salad dressings that contain milk or cheese. Cream. Half and half. Cream cheese. Sour cream. Chip dips made with sour cream or yogurt. The items listed above may not be a complete list of foods and beverages to avoid. Contact your dietitian for more information.  Am I getting enough calcium? Calcium is found in many foods that contain lactose and is important for bone health. The amount of calcium you need depends on your age:  Adults younger than 50 years: 1000 mg of calcium a day.  Adults older than 50 years: 1200 mg of calcium a day. If you are not getting enough calcium, other calcium sources include:  Orange juice with calcium added. There are 300-350 mg of calcium in 1 cup of orange juice.  Sardines with edible bones. There are 325 mg of calcium in 3 oz of sardines.  Calcium-fortified soy milk. There are 300-400 mg of calcium in 1 cup of calcium-fortified soy milk.  Calcium-fortified rice or almond milk. There are 300 mg of calcium in 1 cup of calcium-fortified rice or almond milk.  Canned salmon with edible bones. There are 180 mg of calcium in 3 oz of canned salmon with edible bones.  Calcium-fortified breakfast cereals. There are [phone] mg of calcium in calcium-fortified breakfast cereals.  Tofu set with calcium sulfate. There are 250 mg of calcium in  cup of tofu set with calcium sulfate.  Spinach, cooked. There are  145 mg of calcium in  cup of cooked spinach.  Edamame, cooked. There are 130 mg of calcium in  cup of cooked edamame.  Collard greens, cooked. There are 125 mg of calcium in  cup of cooked collard greens.  Kale, frozen or cooked. There are 90 mg of calcium in  cup of cooked or frozen kale.  Almonds. There are 95 mg of calcium in  cup of  almonds.  Broccoli, cooked. There are 60 mg of calcium in 1 cup of cooked broccoli. This information is not intended to replace advice given to you by your health care provider. Make sure you discuss any questions you have with your health care provider. Document Released: 08/01/2001 Document Revised: 07/18/2015 Document Reviewed: 05/12/2013  2017 Elsevier  Use Phayzme 1-2 capsules three times a day as needed

## 2016-10-13 ENCOUNTER — Other Ambulatory Visit: Payer: Self-pay | Admitting: Physician Assistant

## 2016-10-13 DIAGNOSIS — Z1231 Encounter for screening mammogram for malignant neoplasm of breast: Secondary | ICD-10-CM

## 2016-11-19 ENCOUNTER — Ambulatory Visit
Admission: RE | Admit: 2016-11-19 | Discharge: 2016-11-19 | Disposition: A | Discharge status: Home / Self Care | Payer: Medicare HMO | Source: Ambulatory Visit | Attending: Physician Assistant | Admitting: Physician Assistant

## 2016-11-19 DIAGNOSIS — Z1231 Encounter for screening mammogram for malignant neoplasm of breast: Secondary | ICD-10-CM

## 2017-03-29 ENCOUNTER — Other Ambulatory Visit: Payer: Self-pay | Admitting: Gastroenterology

## 2017-04-01 ENCOUNTER — Telehealth: Payer: Self-pay | Admitting: Gastroenterology

## 2017-04-01 MED ORDER — HYDROCORTISONE 2.5 % RE CREA: 1.0000 "application " | TOPICAL_CREAM | Freq: Two times a day (BID) | RECTAL | 1 refills | Status: DC

## 2017-04-01 NOTE — Telephone Encounter (Signed)
Called patient and informed that med was sent to [facility] as requested

## 2017-07-02 ENCOUNTER — Emergency Department (HOSPITAL_COMMUNITY)
Admission: EM | Admit: 2017-07-02 | Discharge: 2017-07-02 | Disposition: A | Discharge status: Home / Self Care | Payer: Medicare HMO | Attending: Emergency Medicine | Admitting: Emergency Medicine

## 2017-07-02 ENCOUNTER — Emergency Department (HOSPITAL_COMMUNITY): Payer: Medicare HMO

## 2017-07-02 ENCOUNTER — Encounter (HOSPITAL_COMMUNITY): Payer: Self-pay | Admitting: Emergency Medicine

## 2017-07-02 DIAGNOSIS — Z23 Encounter for immunization: Secondary | ICD-10-CM | POA: Diagnosis not present

## 2017-07-02 DIAGNOSIS — E119 Type 2 diabetes mellitus without complications: Secondary | ICD-10-CM | POA: Diagnosis not present

## 2017-07-02 DIAGNOSIS — Y999 Unspecified external cause status: Secondary | ICD-10-CM | POA: Insufficient documentation

## 2017-07-02 DIAGNOSIS — W1830XA Fall on same level, unspecified, initial encounter: Secondary | ICD-10-CM | POA: Diagnosis not present

## 2017-07-02 DIAGNOSIS — Z79899 Other long term (current) drug therapy: Secondary | ICD-10-CM | POA: Insufficient documentation

## 2017-07-02 DIAGNOSIS — I10 Essential (primary) hypertension: Secondary | ICD-10-CM | POA: Insufficient documentation

## 2017-07-02 DIAGNOSIS — S0031XA Abrasion of nose, initial encounter: Secondary | ICD-10-CM

## 2017-07-02 DIAGNOSIS — S51012A Laceration without foreign body of left elbow, initial encounter: Secondary | ICD-10-CM | POA: Diagnosis present

## 2017-07-02 DIAGNOSIS — Y929 Unspecified place or not applicable: Secondary | ICD-10-CM | POA: Insufficient documentation

## 2017-07-02 DIAGNOSIS — R42 Dizziness and giddiness: Secondary | ICD-10-CM | POA: Insufficient documentation

## 2017-07-02 DIAGNOSIS — Y9301 Activity, walking, marching and hiking: Secondary | ICD-10-CM | POA: Diagnosis not present

## 2017-07-02 LAB — CBC WITH DIFFERENTIAL/PLATELET
BASOS ABS: 0 10*3/uL (ref 0.0–0.1)
BASOS PCT: 1 %
EOS ABS: 0.4 10*3/uL (ref 0.0–0.7)
Eosinophils Relative: 4 %
HEMATOCRIT: 41.6 % (ref 36.0–46.0)
Hemoglobin: 13.6 g/dL (ref 12.0–15.0)
Lymphocytes Relative: 22 %
Lymphs Abs: 1.8 10*3/uL (ref 0.7–4.0)
MCH: 31.1 pg (ref 26.0–34.0)
MCHC: 32.7 g/dL (ref 30.0–36.0)
MCV: 95.2 fL (ref 78.0–100.0)
Monocytes Absolute: 0.6 10*3/uL (ref 0.1–1.0)
Monocytes Relative: 7 %
NEUTROS ABS: 5.4 10*3/uL (ref 1.7–7.7)
Neutrophils Relative %: 66 %
Platelets: 302 10*3/uL (ref 150–400)
RBC: 4.37 MIL/uL (ref 3.87–5.11)
RDW: 13.5 % (ref 11.5–15.5)
WBC: 8.2 10*3/uL (ref 4.0–10.5)

## 2017-07-02 LAB — COMPREHENSIVE METABOLIC PANEL
ALK PHOS: 63 U/L (ref 38–126)
ALT: 21 U/L (ref 14–54)
AST: 29 U/L (ref 15–41)
Albumin: 4.6 g/dL (ref 3.5–5.0)
Anion gap: 11 (ref 5–15)
BILIRUBIN TOTAL: 0.6 mg/dL (ref 0.3–1.2)
BUN: 13 mg/dL (ref 6–20)
CALCIUM: 9.5 mg/dL (ref 8.9–10.3)
CO2: 24 mmol/L (ref 22–32)
CREATININE: 0.74 mg/dL (ref 0.44–1.00)
Chloride: 107 mmol/L (ref 101–111)
Glucose, Bld: 109 mg/dL — ABNORMAL HIGH (ref 65–99)
Potassium: 4 mmol/L (ref 3.5–5.1)
Sodium: 142 mmol/L (ref 135–145)
TOTAL PROTEIN: 8.1 g/dL (ref 6.5–8.1)

## 2017-07-02 LAB — TROPONIN I

## 2017-07-02 MED ORDER — HYDROCODONE-ACETAMINOPHEN 5-325 MG PO TABS: 1.0000 | ORAL_TABLET | ORAL | 0 refills | Status: DC | PRN

## 2017-07-02 MED ORDER — ONDANSETRON 8 MG PO TBDP: 8.0000 mg | ORAL_TABLET | Freq: Three times a day (TID) | ORAL | 0 refills | Status: DC | PRN

## 2017-07-02 MED ORDER — TETANUS-DIPHTH-ACELL PERTUSSIS 5-2.5-18.5 LF-MCG/0.5 IM SUSP
0.5000 mL | Freq: Once | INTRAMUSCULAR | Status: AC
  Administered 2017-07-02: 0.5 mL via INTRAMUSCULAR
  Filled 2017-07-02: qty 0.5

## 2017-07-02 NOTE — ED Triage Notes (Signed)
Pt [facility] pt from [facility] fro dizziness and fall today when leaving work. Denies LOC or taking blood thinners. Vitals: 170/74, 72HR, 96% on RA, CBG 96.  Pt has injury to left elbow and nose. Bandages on them at this time,

## 2017-07-02 NOTE — Discharge Instructions (Addendum)
Please have staples removed in 7 to 10 days.

## 2017-07-02 NOTE — ED Provider Notes (Signed)
[facility] DEPT Provider Note   CSN: [id number] Arrival date & time: 07/02/17  1628     History   Chief Complaint Chief Complaint  Patient presents with  . Dizziness  . Fall  . Elbow Injury    HPI Beth Sawyer is a 67 y.o. female.  67 year old female presents after a fall this afternoon.  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  No loss of consciousness.  Did strike the bridge of her nose.  Does not take any blood thinners.  Complains of pain to the bridge of her nose without evidence of epistaxis.  No neck discomfort.  No weakness in arms or legs.  Does have sharp pain to her left elbow as well as a laceration there.  Denies any distal numbness or tingling.  No pain from the waist down.  Prior to becoming dizzy, she did not have any chest pain or shortness of breath.  Did not have any palpitations.  No tinnitus.  This episode is some otherwise she has had before in the past.  No new medications.     Past Medical History:  Diagnosis Date  . Anxiety   . Arthritis   . Benign essential HTN 03/30/2014  . Borderline diabetes    diet controlled  . GERD (gastroesophageal reflux disease)   . Glaucoma   . Hypertension   . OSA (obstructive sleep apnea) 02/28/2016  . PUD (peptic ulcer disease) 03/30/2014  . Thyroid disease     Patient Active Problem List   Diagnosis Date Noted  . OSA (obstructive sleep apnea) 02/28/2016  . Internal hemorrhoids 04/06/2014  . PUD (peptic ulcer disease) 03/30/2014  . Benign essential HTN 03/30/2014  . Non-cardiac chest pain 01/09/2014  . Esophageal reflux 01/09/2014    Past Surgical History:  Procedure Laterality Date  . BREAST BIOPSY Right 1986  . BREAST EXCISIONAL BIOPSY Right    benign  . CATARACT EXTRACTION Right 2014  . CATARACT EXTRACTION Left 02/28/2014  . [place]   x3  . DILATION AND CURETTAGE OF UTERUS     X3  . HEMORRHOID BANDING    . HYSTEROSCOPY W/D&C N/A 05/10/2012   Procedure: DILATATION AND CURETTAGE /HYSTEROSCOPY;  Surgeon: Gus Height, MD;  Location: [facility];  Service: Gynecology;  Laterality: N/A;     OB History   None      Home Medications    Prior to Admission medications   Medication Sig Start Date End Date Taking? Authorizing Provider  Calcium Carbonate-Vitamin D (CALTRATE 600+D PO) Take 1 tablet by mouth 2 (two) times daily.    Yes [provider]  cetirizine (ZYRTEC) 10 MG tablet Take 10 mg by mouth daily. Pt takes generic form of Zyrtec   Yes [provider]  Cholecalciferol (VITAMIN D3) 1000 UNITS CAPS Take 2,000 mg by mouth daily.   Yes [provider]  FLUoxetine (PROZAC) 10 MG capsule Take 10 mg by mouth daily.   Yes [provider]  levothyroxine (LEVOXYL) 75 MCG tablet Take 75 mcg by mouth daily.   Yes [provider]  metoprolol succinate (TOPROL-XL) 50 MG 24 hr tablet Take 50 mg by mouth daily. Take with or immediately following a meal.   Yes [provider]  Multiple Vitamin (MULITIVITAMIN WITH MINERALS) TABS Take 1 tablet by mouth every evening.    Yes [provider]  Omega-3 Fatty Acids (FISH OIL) 1000 MG CAPS Take 1 capsule by mouth 3 (three) times daily.    Yes [provider]  pantoprazole (PROTONIX) 40 MG tablet TAKE ONE TABLET BY MOUTH ONCE DAILY 03/29/17  Yes Nandigam, Kavitha V, MD  rosuvastatin (CRESTOR) 5 MG tablet Take 5 mg by mouth every evening.    Yes [provider]  timolol (TIMOPTIC) 0.5 % ophthalmic solution Place 1 drop into both eyes daily.   Yes [provider]  hydrocortisone (ANUSOL-HC) 2.5 % rectal cream Place 1 application rectally 2 (two) times daily. Patient not taking: Reported on 07/02/2017 04/01/17   Mauri Pole, MD  lansoprazole (PREVACID) 30 MG capsule Take 1 capsule (30 mg total) by mouth  daily. Patient not taking: Reported on 07/02/2017 03/19/15   Mauri Pole, MD    Family History Family History  Problem Relation Age of Onset  . Heart Problems Mother   . Emphysema Sister   . Asthma Sister   . COPD Sister   . Heart disease Brother   . Heart disease Sister   . Congestive Heart Failure Sister   . Breast cancer Sister   . Brain cancer Sister   . Pancreatic cancer Sister   . Colon cancer Neg Hx     Social History Social History   Tobacco Use  . Smoking status: Never Smoker  . Smokeless tobacco: Never Used  Substance Use Topics  . Alcohol use: No    Comment: social/rare  . Drug use: No     Allergies   Morphine and related; Codeine; Oxycodone; Tramadol; and Tape   Review of Systems Review of Systems  All other systems reviewed and are negative.    Physical Exam Updated Vital Signs BP (!) 173/83 (BP Location: Right Arm)   Pulse (!) 55   Temp 98.2 F (36.8 C) (Oral)   Resp 20   Ht 1.549 m (5\' 1" )   Wt 73.5 kg (162 lb)   BMI 30.61 kg/m   Physical Exam  Constitutional: She is oriented to person, place, and time. She appears well-developed and well-nourished.  Non-toxic appearance. No distress.  HENT:  Head: Normocephalic and atraumatic.    Nose: No septal deviation or nasal septal hematoma. No epistaxis.  Eyes: Pupils are equal, round, and reactive to light. Conjunctivae, EOM and lids are normal.  Neck: Normal range of motion. Neck supple. No tracheal deviation present. No thyroid mass present.  Cardiovascular: Normal rate, regular rhythm and normal heart sounds. Exam reveals no gallop.  No murmur heard. Pulmonary/Chest: Effort normal and breath sounds normal. No stridor. No respiratory distress. She has no decreased breath sounds. She has no wheezes. She has no rhonchi. She has no rales.  Abdominal: Soft. Normal appearance and bowel sounds are normal. She exhibits no distension. There is no tenderness. There is no rebound and no CVA  tenderness.  Musculoskeletal: Normal range of motion. She exhibits no edema or tenderness.       Left elbow: She exhibits swelling and laceration. She exhibits normal range of motion and no deformity.       Arms: Neurological: She is alert and oriented to person, place, and time. She has normal strength. No cranial nerve deficit or sensory deficit. GCS eye subscore is 4. GCS verbal subscore is 5. GCS motor subscore is 6.  Skin: Skin is warm and dry. No abrasion and no rash noted.  Psychiatric: She has a normal mood and affect. Her speech is  normal and behavior is normal.  Nursing note and vitals reviewed.    ED Treatments / Results  Labs (all labs ordered are listed, but only abnormal results are displayed) Labs Reviewed  COMPREHENSIVE METABOLIC PANEL - Abnormal; Notable for the following components:      Result Value   Glucose, Bld 109 (*)    All other components within normal limits  CBC WITH DIFFERENTIAL/PLATELET  TROPONIN I    EKG None  Radiology Dg Elbow Complete Left  Result Date: 07/02/2017 CLINICAL DATA:  Fall with elbow pain EXAM: LEFT ELBOW - COMPLETE 3+ VIEW COMPARISON:  None. FINDINGS: There is no evidence of fracture, dislocation, or joint effusion. There is no evidence of arthropathy or other focal bone abnormality. Abrasion at the dorsal elbow skin surface. IMPRESSION: No fracture or dislocation of the left elbow. Electronically Signed   By: Ulyses Jarred M.D.   On: 07/02/2017 17:44    Procedures Procedures (including critical care time)  Medications Ordered in ED Medications - No data to display   Initial Impression / Assessment and Plan / ED Course  I have reviewed the triage vital signs and the nursing notes.  Pertinent labs & imaging results that were available during my care of the patient were reviewed by me and considered in my medical decision making (see chart for details).     LACERATION REPAIR Performed by: Leota Jacobsen Authorized by:  Leota Jacobsen Consent: Verbal consent obtained. Risks and benefits: risks, benefits and alternatives were discussed Consent given by: patient Patient identity confirmed: provided demographic data Prepped and Draped in normal sterile fashion Wound explored  Laceration Location: Left elbow  Laceration Length: 1.5 cm  No Foreign Bodies seen or palpated  Anesthesia: local infiltration    Anesthetic total: 0 ml  Irrigation method: syringe Amount of cleaning: standard  Skin closure: 3 staples   Technique: Simple  Patient tolerance: Patient tolerated the procedure well with no immediate complications.  Patient's tetanus status was updated here.  Patient had no cardiac symptoms prior to this happening.  She has no focal neurological deficits.  Suspect the etiology was from her prior history of dizziness.  Patient will be discharged home and will follow with her doctor.  Final Clinical Impressions(s) / ED Diagnoses   Final diagnoses:  None    ED Discharge Orders    None       Lacretia Leigh, MD 07/02/17 2210

## 2017-09-13 ENCOUNTER — Other Ambulatory Visit: Payer: Self-pay | Admitting: Physician Assistant

## 2017-09-13 DIAGNOSIS — Z1231 Encounter for screening mammogram for malignant neoplasm of breast: Secondary | ICD-10-CM

## 2017-11-22 ENCOUNTER — Ambulatory Visit
Admission: RE | Admit: 2017-11-22 | Discharge: 2017-11-22 | Disposition: A | Discharge status: Home / Self Care | Payer: Medicare HMO | Source: Ambulatory Visit | Attending: Physician Assistant | Admitting: Physician Assistant

## 2017-11-22 DIAGNOSIS — Z1231 Encounter for screening mammogram for malignant neoplasm of breast: Secondary | ICD-10-CM

## 2018-03-26 ENCOUNTER — Other Ambulatory Visit: Payer: Self-pay | Admitting: Gastroenterology

## 2018-06-24 ENCOUNTER — Other Ambulatory Visit: Payer: Self-pay | Admitting: Gastroenterology

## 2018-06-28 ENCOUNTER — Telehealth: Payer: Self-pay | Admitting: Gastroenterology

## 2018-06-28 MED ORDER — PANTOPRAZOLE SODIUM 40 MG PO TBEC: 40.0000 mg | DELAYED_RELEASE_TABLET | Freq: Every day | ORAL | 3 refills | Status: DC

## 2018-06-28 NOTE — Telephone Encounter (Signed)
Protonix sent to patients pharmacy as requested

## 2018-06-28 NOTE — Telephone Encounter (Signed)
Pt called in wanting to get refill on medication .

## 2018-07-01 ENCOUNTER — Encounter: Payer: Self-pay | Admitting: Gastroenterology

## 2018-09-15 ENCOUNTER — Other Ambulatory Visit: Payer: Self-pay | Admitting: Physician Assistant

## 2018-09-15 DIAGNOSIS — Z1231 Encounter for screening mammogram for malignant neoplasm of breast: Secondary | ICD-10-CM

## 2018-09-27 ENCOUNTER — Encounter: Payer: Self-pay | Admitting: Gastroenterology

## 2018-10-19 ENCOUNTER — Other Ambulatory Visit: Payer: Self-pay

## 2018-10-19 ENCOUNTER — Ambulatory Visit (AMBULATORY_SURGERY_CENTER): Payer: Self-pay | Admitting: *Deleted

## 2018-10-19 VITALS — Temp 96.8°F | Ht 61.0 in | Wt 174.0 lb

## 2018-10-19 DIAGNOSIS — Z86010 Personal history of colonic polyps: Secondary | ICD-10-CM

## 2018-10-19 MED ORDER — NA SULFATE-K SULFATE-MG SULF 17.5-3.13-1.6 GM/177ML PO SOLN
1.0000 | Freq: Once | ORAL | 0 refills | Status: AC
Start: 2018-10-19 — End: 2018-10-19

## 2018-10-19 NOTE — Progress Notes (Signed)
No egg or soy allergy known to patient  No issues with past sedation with any surgeries  or procedures, no intubation problems  No diet pills per patient No home 02 use per patient  No blood thinners per patient  Pt denies issues with constipation  No A fib or A flutter  EMMI video sent to pt's e mail  

## 2018-10-24 ENCOUNTER — Encounter: Payer: Self-pay | Admitting: Gastroenterology

## 2018-11-01 ENCOUNTER — Telehealth: Payer: Self-pay

## 2018-11-01 NOTE — Telephone Encounter (Signed)
Pt returned call and answered “No” to all questions.  °  °Pt made aware of that care partner may come to the lobby during the procedure but will need to provide their own mask. ° ° °

## 2018-11-01 NOTE — Telephone Encounter (Signed)
Covid-19 screening questions   Do you now or have you had a fever in the last 14 days?  Do you have any respiratory symptoms of shortness of breath or cough now or in the last 14 days?  Do you have any family members or close contacts with diagnosed or suspected Covid-19 in the past 14 days?  Have you been tested for Covid-19 and found to be positive?       

## 2018-11-02 ENCOUNTER — Other Ambulatory Visit: Payer: Self-pay

## 2018-11-02 ENCOUNTER — Encounter: Payer: Self-pay | Admitting: Gastroenterology

## 2018-11-02 ENCOUNTER — Ambulatory Visit (AMBULATORY_SURGERY_CENTER): Payer: Medicare HMO | Admitting: Gastroenterology

## 2018-11-02 VITALS — BP 143/76 | HR 61 | Temp 98.5°F | Resp 15 | Ht 61.0 in | Wt 174.0 lb

## 2018-11-02 DIAGNOSIS — D124 Benign neoplasm of descending colon: Secondary | ICD-10-CM | POA: Diagnosis not present

## 2018-11-02 DIAGNOSIS — Z86010 Personal history of colonic polyps: Secondary | ICD-10-CM | POA: Diagnosis present

## 2018-11-02 MED ORDER — HYDROCORTISONE ACETATE 25 MG RE SUPP: 25.0000 mg | Freq: Two times a day (BID) | RECTAL | 1 refills | Status: DC

## 2018-11-02 MED ORDER — SODIUM CHLORIDE 0.9 % IV SOLN: 500.0000 mL | Freq: Once | INTRAVENOUS | Status: DC

## 2018-11-02 NOTE — Op Note (Signed)
[facility] Patient Name: Beth Sawyer Procedure Date: 11/02/2018 9:07 AM MRN: [id number] Endoscopist: Mauri Pole , MD Age: 68 Referring MD:  Date of Birth: 1951/01/12 Gender: Female Account #: [id number] Procedure:                Colonoscopy Indications:              High risk colon cancer surveillance: Personal                            history of colonic polyps, High risk colon cancer                            surveillance: Personal history of adenoma less than                            10 mm in size Medicines:                Monitored Anesthesia Care Procedure:                Pre-Anesthesia Assessment:                           - Prior to the procedure, a History and Physical                            was performed, and patient medications and                            allergies were reviewed. The patient's tolerance of                            previous anesthesia was also reviewed. The risks                            and benefits of the procedure and the sedation                            options and risks were discussed with the patient.                            All questions were answered, and informed consent                            was obtained. Prior Anticoagulants: The patient has                            taken no previous anticoagulant or antiplatelet                            agents. ASA Grade Assessment: II - A patient with                            mild systemic disease. After reviewing the risks  and benefits, the patient was deemed in                            satisfactory condition to undergo the procedure.                           After obtaining informed consent, the colonoscope                            was passed under direct vision. Throughout the                            procedure, the patient's blood pressure, pulse, and                            oxygen saturations were monitored  continuously. The                            Colonoscope was introduced through the anus and                            advanced to the the cecum, identified by                            appendiceal orifice and ileocecal valve. The                            colonoscopy was performed without difficulty. The                            patient tolerated the procedure well. The quality                            of the bowel preparation was excellent. The                            ileocecal valve, appendiceal orifice, and rectum                            were photographed. Scope In: 9:12:12 AM Scope Out: 9:24:48 AM Scope Withdrawal Time: 0 hours 9 minutes 47 seconds  Total Procedure Duration: 0 hours 12 minutes 36 seconds  Findings:                 The perianal and digital rectal examinations were                            normal.                           A 5 mm polyp was found in the descending colon. The                            polyp was sessile. The polyp was removed with a  cold snare. Resection and retrieval were complete.                           Scattered small and large-mouthed diverticula were                            found in the sigmoid colon and descending colon.                           Non-bleeding internal hemorrhoids were found during                            retroflexion. The hemorrhoids were small.                           The exam was otherwise without abnormality. Complications:            No immediate complications. Estimated Blood Loss:     Estimated blood loss was minimal. Impression:               - One 5 mm polyp in the descending colon, removed                            with a cold snare. Resected and retrieved.                           - Moderate diverticulosis in the sigmoid colon and                            in the descending colon.                           - Non-bleeding internal hemorrhoids.                            - The examination was otherwise normal. Recommendation:           - Patient has a contact number available for                            emergencies. The signs and symptoms of potential                            delayed complications were discussed with the                            patient. Return to normal activities tomorrow.                            Written discharge instructions were provided to the                            patient.                           - Resume previous diet.                           -  Continue present medications.                           - Await pathology results.                           - Repeat colonoscopy in 5-10 years for surveillance                            based on pathology results. Mauri Pole, MD 11/02/2018 9:28:40 AM This report has been signed electronically.

## 2018-11-02 NOTE — Progress Notes (Signed)
Called to room to assist during endoscopic procedure.  Patient ID and intended procedure confirmed with present staff. Received instructions for my participation in the procedure from the performing physician.  

## 2018-11-02 NOTE — Progress Notes (Signed)
Pt's states no medical or surgical changes since previsit or office visit.  JB temps, CW vitals and MO IV.

## 2018-11-02 NOTE — Progress Notes (Signed)
PT taken to PACU. Monitors in place. VSS. Report given to RN. 

## 2018-11-02 NOTE — Patient Instructions (Signed)
Please read handouts provided. Continue present medications. Await pathology results.        YOU HAD AN ENDOSCOPIC PROCEDURE TODAY AT THE [facility]:   Refer to the procedure report that was given to you for any specific questions about what was found during the examination.  If the procedure report does not answer your questions, please call your gastroenterologist to clarify.  If you requested that your care partner not be given the details of your procedure findings, then the procedure report has been included in a sealed envelope for you to review at your convenience later.  YOU SHOULD EXPECT: Some feelings of bloating in the abdomen. Passage of more gas than usual.  Walking can help get rid of the air that was put into your GI tract during the procedure and reduce the bloating. If you had a lower endoscopy (such as a colonoscopy or flexible sigmoidoscopy) you may notice spotting of blood in your stool or on the toilet paper. If you underwent a bowel prep for your procedure, you may not have a normal bowel movement for a few days.  Please Note:  You might notice some irritation and congestion in your nose or some drainage.  This is from the oxygen used during your procedure.  There is no need for concern and it should clear up in a day or so.  SYMPTOMS TO REPORT IMMEDIATELY:   Following lower endoscopy (colonoscopy or flexible sigmoidoscopy):  Excessive amounts of blood in the stool  Significant tenderness or worsening of abdominal pains  Swelling of the abdomen that is new, acute  Fever of 100F or higher    For urgent or emergent issues, a gastroenterologist can be reached at any hour by calling [phone].   DIET:  We do recommend a small meal at first, but then you may proceed to your regular diet.  Drink plenty of fluids but you should avoid alcoholic beverages for 24 hours.  ACTIVITY:  You should plan to take it easy for the rest of today and you should NOT  DRIVE or use heavy machinery until tomorrow (because of the sedation medicines used during the test).    FOLLOW UP: Our staff will call the number listed on your records 48-72 hours following your procedure to check on you and address any questions or concerns that you may have regarding the information given to you following your procedure. If we do not reach you, we will leave a message.  We will attempt to reach you two times.  During this call, we will ask if you have developed any symptoms of COVID 19. If you develop any symptoms (ie: fever, flu-like symptoms, shortness of breath, cough etc.) before then, please call [phone].  If you test positive for Covid 19 in the 2 weeks post procedure, please call and report this information to us.    If any biopsies were taken you will be contacted by phone or by letter within the next 1-3 weeks.  Please call us at [phone] if you have not heard about the biopsies in 3 weeks.    SIGNATURES/CONFIDENTIALITY: You and/or your care partner have signed paperwork which will be entered into your electronic medical record.  These signatures attest to the fact that that the information above on your After Visit Summary has been reviewed and is understood.  Full responsibility of the confidentiality of this discharge information lies with you and/or your care-partner. 

## 2018-11-04 ENCOUNTER — Other Ambulatory Visit: Payer: Self-pay

## 2018-11-04 ENCOUNTER — Telehealth: Payer: Self-pay | Admitting: Gastroenterology

## 2018-11-04 ENCOUNTER — Telehealth: Payer: Self-pay

## 2018-11-04 ENCOUNTER — Telehealth: Payer: Self-pay | Admitting: *Deleted

## 2018-11-04 MED ORDER — HYDROCORTISONE (PERIANAL) 2.5 % EX CREA
1.0000 | TOPICAL_CREAM | Freq: Two times a day (BID) | CUTANEOUS | 1 refills | Status: AC
Start: 2018-11-04 — End: ?

## 2018-11-04 NOTE — Telephone Encounter (Signed)
  Follow up Call-  Call back number 11/02/2018  Post procedure Call Back phone  # [phone]  Permission to leave phone message Yes  Some recent data might be hidden     Patient questions:  Do you have a fever, pain , or abdominal swelling? No. Pain Score  0 *  Have you tolerated food without any problems? Yes.    Have you been able to return to your normal activities? Yes.    Do you have any questions about your discharge instructions: Diet   No. Medications  No. Follow up visit  No.  Do you have questions or concerns about your Care? Yes  Pt states she had asked Dr Silverio Decamp for a prescription for her hemorrhoids.  Actions: * If pain score is 4 or above: No action needed, pain <4.   1. Have you developed a fever since your procedure? no  2.   Have you had an respiratory symptoms (SOB or cough) since your procedure? no  3.   Have you tested positive for COVID 19 since your procedure no  4.   Have you had any family members/close contacts diagnosed with the COVID 19 since your procedure?  no   If yes to any of these questions please route to Joylene John, RN and Alphonsa Gin, Therapist, sports.

## 2018-11-04 NOTE — Telephone Encounter (Signed)
Called pt and let her know via vm message Dr Silverio Decamp had sent a Rx for the anusol suppository on 11/02/18.

## 2018-11-04 NOTE — Telephone Encounter (Signed)
No answer for post procedure follow up call. Will call back later this afternoon. SM

## 2018-11-04 NOTE — Telephone Encounter (Signed)
I already sent a prescription for Anusol [hydrocortisone] suppository to her pharmacy on the day of procedure.  Please inform patient.  Thank you

## 2018-11-07 ENCOUNTER — Encounter: Payer: Self-pay | Admitting: Gastroenterology

## 2018-11-24 ENCOUNTER — Ambulatory Visit
Admission: RE | Admit: 2018-11-24 | Discharge: 2018-11-24 | Disposition: A | Discharge status: Home / Self Care | Payer: Medicare HMO | Source: Ambulatory Visit | Attending: Physician Assistant | Admitting: Physician Assistant

## 2018-11-24 ENCOUNTER — Other Ambulatory Visit: Payer: Self-pay

## 2018-11-24 DIAGNOSIS — Z1231 Encounter for screening mammogram for malignant neoplasm of breast: Secondary | ICD-10-CM

## 2018-12-12 ENCOUNTER — Other Ambulatory Visit: Payer: Self-pay | Admitting: Physician Assistant

## 2018-12-12 DIAGNOSIS — E2839 Other primary ovarian failure: Secondary | ICD-10-CM

## 2019-02-23 ENCOUNTER — Other Ambulatory Visit: Payer: Medicare HMO

## 2019-05-10 ENCOUNTER — Ambulatory Visit: Payer: Medicare HMO | Admitting: Gastroenterology

## 2019-05-10 ENCOUNTER — Encounter: Payer: Self-pay | Admitting: Gastroenterology

## 2019-05-10 VITALS — BP 118/62 | HR 61 | Temp 98.3°F | Ht 61.0 in | Wt 179.1 lb

## 2019-05-10 DIAGNOSIS — E739 Lactose intolerance, unspecified: Secondary | ICD-10-CM | POA: Diagnosis not present

## 2019-05-10 DIAGNOSIS — K580 Irritable bowel syndrome with diarrhea: Secondary | ICD-10-CM

## 2019-05-10 DIAGNOSIS — R140 Abdominal distension (gaseous): Secondary | ICD-10-CM

## 2019-05-10 NOTE — Progress Notes (Signed)
Beth Sawyer    AN:[id number]    1950/03/27  Primary Care Physician:Spencer, Domingo Pulse, PA-C  Referring Physician: Aura Dials, PA-C [place],  [place] [address]   Chief complaint:  Bloating  HPI:  69 yr F with chronic GERD with c/o excessive bloating and weight gain  No identifiable triggers for excessive bloating.  She does not drink milk because it upsets her stomach but does not avoid all milk products.  She eats occasional ice cream and also eats some cheese. On average she has 3-5 bowel movements per day No blood in stool or melena.  Denies any vomiting or abdominal pain.  Colonoscopy 11/02/2018:   80mm polyp removed from descending colon. Sigmoid diverticulosis.   EGD November 2015: 2 small gastric ulcers and duodenitis  Colonoscopy April 2015: Small tubular adenoma removed and small internal hemorrhoids  Outpatient Encounter Medications as of 05/10/2019  Medication Sig  . acetaminophen (TYLENOL) 650 MG CR tablet Take by mouth.  . Calcium Carbonate-Vitamin D (CALTRATE 600+D PO) Take 1 tablet by mouth 2 (two) times daily.   . cetirizine (ZYRTEC) 10 MG tablet Take 10 mg by mouth daily. Pt takes generic form of Zyrtec  . Cholecalciferol (VITAMIN D3) 1000 UNITS CAPS Take 2,000 mg by mouth daily.  . Cyanocobalamin (VITAMIN B12 PO) Take by mouth.  . ELDERBERRY PO Take by mouth.  Marland Kitchen FLUoxetine (PROZAC) 10 MG capsule Take 10 mg by mouth daily.  Marland Kitchen HYDROcodone-acetaminophen (NORCO/VICODIN) 5-325 MG tablet Take 1-2 tablets by mouth every 4 (four) hours as needed. (Patient not taking: Reported on 10/19/2018)  . hydrocortisone (ANUSOL-HC) 2.5 % rectal cream Place 1 application rectally 2 (two) times daily.  . lansoprazole (PREVACID) 30 MG capsule Take 1 capsule (30 mg total) by mouth daily. (Patient not taking: Reported on 07/02/2017)  . levothyroxine (LEVOXYL) 75 MCG tablet Take 75 mcg by mouth daily.  Marland Kitchen lisinopril (ZESTRIL) 2.5 MG tablet Take 1 tablet by  mouth once daily  . MAGNESIUM PO Take by mouth.  . metoprolol succinate (TOPROL-XL) 25 MG 24 hr tablet Take 1 tablet by mouth once daily  . Multiple Vitamin (MULITIVITAMIN WITH MINERALS) TABS Take 1 tablet by mouth every evening.   . Omega-3 Fatty Acids (FISH OIL) 1000 MG CAPS Take 1 capsule by mouth 3 (three) times daily.   . ondansetron (ZOFRAN ODT) 8 MG disintegrating tablet Take 1 tablet (8 mg total) by mouth every 8 (eight) hours as needed for nausea or vomiting.  . pantoprazole (PROTONIX) 40 MG tablet Take 1 tablet (40 mg total) by mouth daily.  . rosuvastatin (CRESTOR) 5 MG tablet Take 5 mg by mouth every evening.   . timolol (TIMOPTIC) 0.5 % ophthalmic solution Place 1 drop into both eyes daily.  . Turmeric (QC TUMERIC COMPLEX) 500 MG CAPS Take by mouth.   No facility-administered encounter medications on file as of 05/10/2019.    Allergies as of 05/10/2019 - Review Complete 11/02/2018  Allergen Reaction Noted  . Morphine and related Nausea And Vomiting 05/02/2012  . Codeine Nausea And Vomiting 04/16/2011  . Oxycodone Nausea And Vomiting 04/27/2012  . Tramadol Nausea And Vomiting 12/28/2014  . Tape Rash 04/06/2014    Past Medical History:  Diagnosis Date  . Allergy    seasonal  . Anxiety   . Arthritis   . Benign essential HTN 03/30/2014  . Borderline diabetes    diet controlled  . Cataract  bilateral removed  . GERD (gastroesophageal reflux disease)   . Glaucoma   . Hyperlipidemia   . Hypertension   . OSA (obstructive sleep apnea) 02/28/2016  . PUD (peptic ulcer disease) 03/30/2014  . Sleep apnea    wear cpap  . Thyroid disease     Past Surgical History:  Procedure Laterality Date  . BREAST BIOPSY Right 1986  . BREAST EXCISIONAL BIOPSY Right    benign  . CATARACT EXTRACTION Right 2014  . CATARACT EXTRACTION Left 02/28/2014  . [place]   x3  . COLONOSCOPY    . DILATION AND CURETTAGE OF UTERUS     X3  . HEMORRHOID BANDING    .  HYSTEROSCOPY WITH D & C N/A 05/10/2012   Procedure: DILATATION AND CURETTAGE /HYSTEROSCOPY;  Surgeon: Gus Height, MD;  Location: [facility];  Service: Gynecology;  Laterality: N/A;  . POLYPECTOMY      Family History  Problem Relation Age of Onset  . Heart Problems Mother   . Emphysema Sister   . Asthma Sister   . COPD Sister   . Heart disease Brother   . Heart disease Sister   . Congestive Heart Failure Sister   . Breast cancer Sister   . Brain cancer Sister   . Colon polyps Sister   . Pancreatic cancer Sister   . Breast cancer Sister   . Colon cancer Neg Hx   . Esophageal cancer Neg Hx   . Rectal cancer Neg Hx   . Stomach cancer Neg Hx     Social History   Socioeconomic History  . Marital status: Widowed    Spouse name: Not on file  . Number of children: 3  . Years of education: Not on file  . Highest education level: Not on file  Occupational History  . Occupation: caregiver  Tobacco Use  . Smoking status: Never Smoker  . Smokeless tobacco: Never Used  Substance and Sexual Activity  . Alcohol use: No    Comment: social/rare  . Drug use: No  . Sexual activity: Not on file  Other Topics Concern  . Not on file  Social History Narrative  . Not on file   Social Determinants of Health   Financial Resource Strain:   . Difficulty of Paying Living Expenses:   Food Insecurity:   . Worried About Charity fundraiser in the Last Year:   . Arboriculturist in the Last Year:   Transportation Needs:   . Film/video editor (Medical):   Marland Kitchen Lack of Transportation (Non-Medical):   Physical Activity:   . Days of Exercise per Week:   . Minutes of Exercise per Session:   Stress:   . Feeling of Stress :   Social Connections:   . Frequency of Communication with Friends and Family:   . Frequency of Social Gatherings with Friends and Family:   . Attends Religious Services:   . Active Member of Clubs or Organizations:   . Attends Archivist Meetings:   Marland Kitchen Marital  Status:   Intimate Partner Violence:   . Fear of Current or Ex-Partner:   . Emotionally Abused:   Marland Kitchen Physically Abused:   . Sexually Abused:       Review of systems:  All other review of systems negative except as mentioned in the HPI.   Physical Exam: Vitals:   05/10/19 1328  BP: 118/62  Pulse: 61  Temp: 98.3 F (36.8 C)  Body mass index is 33.85 kg/m. Gen:      No acute distress Abd:      + bowel sounds; soft, non-tender; no palpable masses, no distension Neuro: alert and oriented x 3 Psych: normal mood and affect  Data Reviewed:  Reviewed labs, radiology imaging, old records and pertinent past GI work up   Assessment and Plan/Recommendations:  69 year old female here with complaints of excessive bloating  Trial of lactose-free diet for 1 to 2 weeks  IBgard 1 capsule up to 3 times daily  Fiberchoice 1 tablet once or twice daily as tolerated  Increase daily water intake  Will obtain lactulose breath test to assess for small intestinal bacterial overgrowth exacerbating abdominal bloating and IBS symptoms  Based on SIBO test results, will consider treatment with a course of Xifaxan for SIBO and IBS-D  The patient was provided an opportunity to ask questions and all were answered. The patient agreed with the plan and demonstrated an understanding of the instructions.  Damaris Hippo , MD    CC: Aura Dials, PA-C

## 2019-05-10 NOTE — Patient Instructions (Addendum)
If you are age 70 or older, your body mass index should be between 23-30. Your Body mass index is 33.85 kg/m. If this is out of the aforementioned range listed, please consider follow up with your Primary Care Provider.  If you are age 70 or younger, your body mass index should be between 19-25. Your Body mass index is 33.85 kg/m. If this is out of the aformentioned range listed, please consider follow up with your Primary Care Provider.   You have been given a Lactose free diet. See below.  Start Fiber Choice -  1 tablet  1-2 times daily.( you have been given samples) You can buy this over-the-counter.  Start IBgard - take 1 capsule three times daily.  ( you have been given samples) You can buy this over-the-counter.  You have been given a testing kit to check for small intestine bacterial overgrowth (SIBO) which is completed by a company named [facility]. Make sure to return your test in the mail using the return mailing label given you along with the kit. Your demographic and insurance information have already been sent to the company and they should be in contact with you over the next week regarding this test. Make sure to discuss with [facility] PRIOR to having the test if they have gotten informatoin from your insurance company as to how much your testing will cost out of pocket, if any. Please keep in mind that you will be getting a call from phone number [phone] or a similar number. If you do not hear from them within this time frame, please call our office at [phone].    Follow up with me on 07/04/19 at 1:30 pm  Lactose-Free Diet, Adult If you have lactose intolerance, you are not able to digest lactose. Lactose is a natural sugar found mainly in dairy milk and dairy products. You may need to avoid all foods and beverages that contain lactose. A lactose-free diet can help you do this. Which foods have lactose? Lactose is found in dairy milk and dairy products, such  as:  Yogurt.  Cheese.  Butter.  Margarine.  Sour cream.  Cream.  Whipped toppings and nondairy creamers.  Ice cream and other dairy-based desserts. Lactose is also found in foods or products made with dairy milk or milk ingredients. To find out whether a food contains dairy milk or a milk ingredient, look at the ingredients list. Avoid foods with the statement "May contain milk" and foods that contain:  Milk powder.  Whey.  Curd.  Caseinate.  Lactose.  Lactalbumin.  Lactoglobulin. What are alternatives to dairy milk and foods made with milk products?  Lactose-free milk.  Soy milk with added calcium and vitamin D.  Almond milk, coconut milk, rice milk, or other nondairy milk alternatives with added calcium and vitamin D. Note that these are low in protein.  Soy products, such as soy yogurt, soy cheese, soy ice cream, and soy-based sour cream.  Other nut milk products, such as almond yogurt, almond cheese, cashew yogurt, cashew cheese, cashew ice cream, coconut yogurt, and coconut ice cream. What are tips for following this plan?  Do not consume foods, beverages, vitamins, minerals, or medicines containing lactose. Read ingredient lists carefully.  Look for the words "lactose-free" on labels.  Use lactase enzyme drops or tablets as directed by your health care provider.  Use lactose-free milk or a milk alternative, such as soy milk or almond milk, for drinking and cooking.  Make sure you get enough calcium  and vitamin D in your diet. A lactose-free eating plan can be lacking in these important nutrients.  Take calcium and vitamin D supplements as directed by your health care provider. Talk to your health care provider about supplements if you are not able to get enough calcium and vitamin D from food. What foods can I eat?  Fruits All fresh, canned, frozen, or dried fruits that are not processed with lactose. Vegetables All fresh, frozen, and canned  vegetables without cheese, cream, or butter sauces. Grains Any that are not made with dairy milk or dairy products. Meats and other proteins Any meat, fish, poultry, and other protein sources that are not made with dairy milk or dairy products. Soy cheese and yogurt. Fats and oils Any that are not made with dairy milk or dairy products. Beverages Lactose-free milk. Soy, rice, or almond milk with added calcium and vitamin D. Fruit and vegetable juices. Sweets and desserts Any that are not made with dairy milk or dairy products. Seasonings and condiments Any that are not made with dairy milk or dairy products. Calcium Calcium is found in many foods that contain lactose and is important for bone health. The amount of calcium you need depends on your age:  Adults younger than 70 years: 1,000 mg of calcium a day.  Adults older than 50 years: 1,200 mg of calcium a day. If you are not getting enough calcium, you may get it from other sources, including:  Orange juice with calcium added. There are 300-350 mg of calcium in 1 cup of orange juice.  Calcium-fortified soy milk. There are 300-400 mg of calcium in 1 cup of calcium-fortified soy milk.  Calcium-fortified rice or almond milk. There are 300 mg of calcium in 1 cup of calcium-fortified rice or almond milk.  Calcium-fortified breakfast cereals. There are 100-1,000 mg of calcium in calcium-fortified breakfast cereals.  Spinach, cooked. There are 145 mg of calcium in  cup of cooked spinach.  Edamame, cooked. There are 130 mg of calcium in  cup of cooked edamame.  Collard greens, cooked. There are 125 mg of calcium in  cup of cooked collard greens.  Kale, frozen or cooked. There are 90 mg of calcium in  cup of cooked or frozen kale.  Almonds. There are 95 mg of calcium in  cup of almonds.  Broccoli, cooked. There are 60 mg of calcium in 1 cup of cooked broccoli. The items listed above may not be a complete list of recommended  foods and beverages. Contact a dietitian for more options. What foods are not recommended? Fruits None, unless they are made with dairy milk or dairy products. Vegetables None, unless they are made with dairy milk or dairy products. Grains Any grains that are made with dairy milk or dairy products. Meats and other proteins None, unless they are made with dairy milk or dairy products. Dairy All dairy products, including milk, goat's milk, buttermilk, kefir, acidophilus milk, flavored milk, evaporated milk, condensed milk, dulce de [place], eggnog, yogurt, cheese, and cheese spreads. Fats and oils Any that are made with milk or milk products. Margarines and salad dressings that contain milk or cheese. Cream. Half and half. Cream cheese. Sour cream. Chip dips made with sour cream or yogurt. Beverages Hot chocolate. Cocoa with lactose. Instant iced teas. Powdered fruit drinks. Smoothies made with dairy milk or yogurt. Sweets and desserts Any that are made with milk or milk products. Seasonings and condiments Chewing gum that has lactose. Spice blends if they contain  lactose. Artificial sweeteners that contain lactose. Nondairy creamers. The items listed above may not be a complete list of foods and beverages to avoid. Contact a dietitian for more information. Summary  If you are lactose intolerant, it means that you have a hard time digesting lactose, a natural sugar found in milk and milk products.  Following a lactose-free diet can help you manage this condition.  Calcium is important for bone health and is found in many foods that contain lactose. Talk with your health care provider about other sources of calcium. This information is not intended to replace advice given to you by your health care provider. Make sure you discuss any questions you have with your health care provider. Document Revised: 03/09/2017 Document Reviewed: 03/09/2017 Elsevier Patient Education  2020 [employer].

## 2019-05-16 ENCOUNTER — Encounter: Payer: Self-pay | Admitting: Gastroenterology

## 2019-05-19 ENCOUNTER — Telehealth: Payer: Self-pay | Admitting: Gastroenterology

## 2019-05-19 NOTE — Telephone Encounter (Signed)
Pt stated that she has received a breath kit but does not have instructions.

## 2019-05-19 NOTE — Telephone Encounter (Signed)
Called the patient back. No answer. Left the information from her AVS that includes the telephone number for [facility]  [phone]. She needs to call them for instructions and she is also to call them before doing the test.

## 2019-06-19 ENCOUNTER — Other Ambulatory Visit: Payer: Self-pay | Admitting: Gastroenterology

## 2019-07-04 ENCOUNTER — Ambulatory Visit: Payer: Medicare HMO | Admitting: Gastroenterology

## 2019-08-14 ENCOUNTER — Ambulatory Visit: Payer: Medicare HMO | Admitting: Gastroenterology

## 2019-08-14 ENCOUNTER — Encounter: Payer: Self-pay | Admitting: Gastroenterology

## 2019-08-14 VITALS — BP 118/74 | HR 70 | Ht 61.0 in | Wt 181.0 lb

## 2019-08-14 DIAGNOSIS — R140 Abdominal distension (gaseous): Secondary | ICD-10-CM | POA: Diagnosis not present

## 2019-08-14 DIAGNOSIS — K581 Irritable bowel syndrome with constipation: Secondary | ICD-10-CM | POA: Diagnosis not present

## 2019-08-14 DIAGNOSIS — K219 Gastro-esophageal reflux disease without esophagitis: Secondary | ICD-10-CM | POA: Diagnosis not present

## 2019-08-14 NOTE — Patient Instructions (Signed)
Continue Metamucil 1 tablespoon 2-3 times daily  IBGard 1 three times daily as needed  Continue Prevacid daily  Follow up in 1-2 years  I appreciate the  opportunity to care for you  Thank You   Harl Bowie , MD

## 2019-08-14 NOTE — Progress Notes (Signed)
Beth Sawyer    [id number]    Jul 23, 1950  Primary Care Physician:Spencer, Domingo Pulse, PA-C  Referring Physician: Aura Dials, PA-C [place],  [place] [address]   Chief complaint: GERD, abdominal bloating  HPI:  69 year old very pleasant female here for follow-up visit for chronic GERD and abdominal bloating  Overall her symptoms have improved significantly with lactose-free diet.  She is using Metamucil with improvement of bowel habits.  No longer has significant abdominal bloating.  She is also using IBgard as needed.  Colonoscopy 11/02/2018: 55mm polyp removed from descending colon. Sigmoid diverticulosis.   EGD November 2015:2 small gastric ulcers and duodenitis  Colonoscopy April 2015:Small tubular adenoma removed and small internal hemorrhoids   Outpatient Encounter Medications as of 08/14/2019  Medication Sig  . acetaminophen (TYLENOL) 650 MG CR tablet Take by mouth.  . Calcium Carbonate-Vitamin D (CALTRATE 600+D PO) Take 1 tablet by mouth 2 (two) times daily.   . cetirizine (ZYRTEC) 10 MG tablet Take 10 mg by mouth daily. Pt takes generic form of Zyrtec  . Cholecalciferol (VITAMIN D3) 1000 UNITS CAPS Take 2,000 mg by mouth daily.  . Cyanocobalamin (VITAMIN B12 PO) Take by mouth.  Marland Kitchen FLUoxetine (PROZAC) 10 MG capsule Take 10 mg by mouth daily.  . hydrocortisone (ANUSOL-HC) 2.5 % rectal cream Place 1 application rectally 2 (two) times daily. (Patient taking differently: Place 1 application rectally as needed. )  . lansoprazole (PREVACID) 30 MG capsule Take 1 capsule (30 mg total) by mouth daily.  Marland Kitchen levothyroxine (LEVOXYL) 75 MCG tablet Take 75 mcg by mouth daily.  Marland Kitchen lisinopril (ZESTRIL) 2.5 MG tablet Take 1 tablet by mouth once daily  . metoprolol succinate (TOPROL-XL) 25 MG 24 hr tablet Take 1 tablet by mouth once daily  . Multiple Vitamin (MULITIVITAMIN WITH MINERALS) TABS Take 1 tablet by mouth every evening.   . Omega-3 Fatty Acids  (FISH OIL) 1000 MG CAPS Take 1 capsule by mouth 3 (three) times daily.   . pantoprazole (PROTONIX) 40 MG tablet Take 1 tablet by mouth once daily  . rosuvastatin (CRESTOR) 5 MG tablet Take 5 mg by mouth every evening.   . timolol (TIMOPTIC) 0.5 % ophthalmic solution Place 1 drop into both eyes daily.  . Turmeric (QC TUMERIC COMPLEX) 500 MG CAPS Take by mouth.   No facility-administered encounter medications on file as of 08/14/2019.    Allergies as of 08/14/2019 - Review Complete 05/16/2019  Allergen Reaction Noted  . Morphine and related Nausea And Vomiting 05/02/2012  . Codeine Nausea And Vomiting 04/16/2011  . Oxycodone Nausea And Vomiting 04/27/2012  . Tramadol Nausea And Vomiting 12/28/2014  . Tape Rash 04/06/2014    Past Medical History:  Diagnosis Date  . Allergy    seasonal  . Anxiety   . Arthritis   . Benign essential HTN 03/30/2014  . Borderline diabetes    diet controlled  . Cataract    bilateral removed  . GERD (gastroesophageal reflux disease)   . Glaucoma   . Hyperlipidemia   . Hypertension   . OSA (obstructive sleep apnea) 02/28/2016  . PUD (peptic ulcer disease) 03/30/2014  . Sleep apnea    wear cpap  . Thyroid disease     Past Surgical History:  Procedure Laterality Date  . BREAST BIOPSY Right 1986  . BREAST EXCISIONAL BIOPSY Right    benign  . CATARACT EXTRACTION Right 2014  . CATARACT EXTRACTION Left 02/28/2014  .  [place]   x3  . COLONOSCOPY    . DILATION AND CURETTAGE OF UTERUS     X3  . HEMORRHOID BANDING    . HYSTEROSCOPY WITH D & C N/A 05/10/2012   Procedure: DILATATION AND CURETTAGE /HYSTEROSCOPY;  Surgeon: Gus Height, MD;  Location: [facility];  Service: Gynecology;  Laterality: N/A;  . POLYPECTOMY      Family History  Problem Relation Age of Onset  . Heart Problems Mother   . Emphysema Sister   . Asthma Sister   . COPD Sister   . Heart disease Brother   . Heart disease Sister   . Congestive Heart Failure Sister     . Breast cancer Sister   . Brain cancer Sister   . Colon polyps Sister   . Pancreatic cancer Sister   . Breast cancer Sister   . Colon cancer Neg Hx   . Esophageal cancer Neg Hx   . Rectal cancer Neg Hx   . Stomach cancer Neg Hx     Social History   Socioeconomic History  . Marital status: Widowed    Spouse name: Not on file  . Number of children: 3  . Years of education: Not on file  . Highest education level: Not on file  Occupational History  . Occupation: caregiver  Tobacco Use  . Smoking status: Never Smoker  . Smokeless tobacco: Never Used  Vaping Use  . Vaping Use: Never used  Substance and Sexual Activity  . Alcohol use: No    Comment: social/rare  . Drug use: No  . Sexual activity: Not on file  Other Topics Concern  . Not on file  Social History Narrative  . Not on file   Social Determinants of Health   Financial Resource Strain:   . Difficulty of Paying Living Expenses:   Food Insecurity:   . Worried About Charity fundraiser in the Last Year:   . Arboriculturist in the Last Year:   Transportation Needs:   . Film/video editor (Medical):   Marland Kitchen Lack of Transportation (Non-Medical):   Physical Activity:   . Days of Exercise per Week:   . Minutes of Exercise per Session:   Stress:   . Feeling of Stress :   Social Connections:   . Frequency of Communication with Friends and Family:   . Frequency of Social Gatherings with Friends and Family:   . Attends Religious Services:   . Active Member of Clubs or Organizations:   . Attends Archivist Meetings:   Marland Kitchen Marital Status:   Intimate Partner Violence:   . Fear of Current or Ex-Partner:   . Emotionally Abused:   Marland Kitchen Physically Abused:   . Sexually Abused:       Review of systems:  All other review of systems negative except as mentioned in the HPI.   Physical Exam: Vitals:   08/14/19 1447  BP: 118/74  Pulse: 70   Body mass index is 34.2 kg/m. Gen:      No acute  distress Neuro: alert and oriented x 3 Psych: normal mood and affect  Data Reviewed:  Reviewed labs, radiology imaging, old records and pertinent past GI work up   Assessment and Plan/Recommendations: 69 year old very pleasant female here for follow-up visit for abdominal bloating and GERD  Abdominal bloating: Continue lactose-free diet Metamucil 1 tablespoon 2-3 times daily with meals IBgard 1 capsule up to 3 times daily as needed  GERD: Continue antireflux measures  Prevacid 30 mg daily  Return in 1 year or sooner if needed   The patient was provided an opportunity to ask questions and all were answered. The patient agreed with the plan and demonstrated an understanding of the instructions.  Damaris Hippo , MD    CC: Aura Dials, PA-C

## 2019-08-22 ENCOUNTER — Encounter: Payer: Self-pay | Admitting: Gastroenterology

## 2019-09-15 ENCOUNTER — Other Ambulatory Visit: Payer: Self-pay | Admitting: Physician Assistant

## 2019-09-15 DIAGNOSIS — Z1231 Encounter for screening mammogram for malignant neoplasm of breast: Secondary | ICD-10-CM

## 2019-10-16 ENCOUNTER — Other Ambulatory Visit: Payer: Self-pay

## 2019-10-16 ENCOUNTER — Ambulatory Visit (INDEPENDENT_AMBULATORY_CARE_PROVIDER_SITE_OTHER): Payer: Medicare HMO

## 2019-10-16 ENCOUNTER — Ambulatory Visit: Payer: Medicare HMO | Admitting: Family Medicine

## 2019-10-16 VITALS — BP 120/80 | HR 63 | Ht 61.0 in | Wt 184.0 lb

## 2019-10-16 DIAGNOSIS — M545 Low back pain, unspecified: Secondary | ICD-10-CM

## 2019-10-16 DIAGNOSIS — M25512 Pain in left shoulder: Secondary | ICD-10-CM

## 2019-10-16 DIAGNOSIS — M5030 Other cervical disc degeneration, unspecified cervical region: Secondary | ICD-10-CM

## 2019-10-16 DIAGNOSIS — M542 Cervicalgia: Secondary | ICD-10-CM

## 2019-10-16 DIAGNOSIS — M5136 Other intervertebral disc degeneration, lumbar region: Secondary | ICD-10-CM

## 2019-10-16 DIAGNOSIS — G8929 Other chronic pain: Secondary | ICD-10-CM | POA: Diagnosis not present

## 2019-10-16 MED ORDER — GABAPENTIN 100 MG PO CAPS: 100.0000 mg | ORAL_CAPSULE | Freq: Every day | ORAL | 3 refills | Status: DC

## 2019-10-16 MED ORDER — TIZANIDINE HCL 4 MG PO TABS: 4.0000 mg | ORAL_TABLET | Freq: Every day | ORAL | 1 refills | Status: DC

## 2019-10-16 NOTE — Patient Instructions (Addendum)
Good to see you zanaflex 100 mg gabapentin Tart cherry 1200 mg at night Iron 65 mg with vitamin C 500 mg See me again in 6 weeks

## 2019-10-16 NOTE — Progress Notes (Signed)
[facility] [address] [place] Phone: [phone] Subjective:   I [name] am serving as a Education administrator for Dr. Hulan Saas.  This visit occurred during the SARS-CoV-2 public health emergency.  Safety protocols were in place, including screening questions prior to the visit, additional usage of staff PPE, and extensive cleaning of exam room while observing appropriate contact time as indicated for disinfecting solutions.   I'm seeing this patient by the request  of:  Aura Dials, PA-C  CC: Shoulder and back pain  [id number]  Beth Sawyer is a 69 y.o. female coming in with complaint of bilateral shoulder and back pain. Back is worse. States she is having muscle spasms and catch. Yesterday was a bad day. States she can hardly walk. Pain radiates down her legs. Left sided frozen shoulder. Patient gets injections. Pain bilaterally in the ribs. Has been to PT for the shoulders. Bilateral posterior shoulder pain.   Onset- Chronic  Location - back Duration-  Character- sharp  Aggravating factors- walking, sitting, standing  Reliving factors-  Therapies tried- bio freeze, topical medications, pain medication (about 6 a day), heat with massage, cyclobenzaprine   Severity- 8/10 at its worse for the back      Past Medical History:  Diagnosis Date  . Allergy    seasonal  . Anxiety   . Arthritis    neck, back and legs   . Benign essential HTN 03/30/2014  . Borderline diabetes    diet controlled  . Cataract    bilateral removed  . GERD (gastroesophageal reflux disease)   . Glaucoma   . Hyperlipidemia   . Hypertension   . OSA (obstructive sleep apnea) 02/28/2016  . PUD (peptic ulcer disease) 03/30/2014  . Sleep apnea    wear cpap  . Thyroid disease    Past Surgical History:  Procedure Laterality Date  . BREAST BIOPSY Right 1986  . BREAST EXCISIONAL BIOPSY Right    benign  . CATARACT EXTRACTION Right 2014  . CATARACT  EXTRACTION Left 02/28/2014  . [place]   x3  . COLONOSCOPY    . DILATION AND CURETTAGE OF UTERUS     X3  . HEMORRHOID BANDING    . HYSTEROSCOPY WITH D & C N/A 05/10/2012   Procedure: DILATATION AND CURETTAGE /HYSTEROSCOPY;  Surgeon: Gus Height, MD;  Location: [facility];  Service: Gynecology;  Laterality: N/A;  . POLYPECTOMY     Social History   Socioeconomic History  . Marital status: Widowed    Spouse name: Not on file  . Number of children: 3  . Years of education: Not on file  . Highest education level: Not on file  Occupational History  . Occupation: caregiver  Tobacco Use  . Smoking status: Never Smoker  . Smokeless tobacco: Never Used  Vaping Use  . Vaping Use: Never used  Substance and Sexual Activity  . Alcohol use: No    Comment: social/rare  . Drug use: No  . Sexual activity: Not on file  Other Topics Concern  . Not on file  Social History Narrative  . Not on file   Social Determinants of Health   Financial Resource Strain:   . Difficulty of Paying Living Expenses: Not on file  Food Insecurity:   . Worried About Charity fundraiser in the Last Year: Not on file  . Ran Out of Food in the Last Year: Not on file  Transportation  Needs:   . Lack of Transportation (Medical): Not on file  . Lack of Transportation (Non-Medical): Not on file  Physical Activity:   . Days of Exercise per Week: Not on file  . Minutes of Exercise per Session: Not on file  Stress:   . Feeling of Stress : Not on file  Social Connections:   . Frequency of Communication with Friends and Family: Not on file  . Frequency of Social Gatherings with Friends and Family: Not on file  . Attends Religious Services: Not on file  . Active Member of Clubs or Organizations: Not on file  . Attends Archivist Meetings: Not on file  . Marital Status: Not on file   Allergies  Allergen Reactions  . Morphine And Related Nausea And Vomiting  . Codeine Nausea And  Vomiting    "made me real hot"  . Oxycodone Nausea And Vomiting  . Tramadol Nausea And Vomiting  . Tape Rash   Family History  Problem Relation Age of Onset  . Heart Problems Mother   . Emphysema Sister   . Asthma Sister   . COPD Sister   . Heart disease Brother   . Heart disease Sister   . Congestive Heart Failure Sister   . Breast cancer Sister   . Brain cancer Sister   . Colon polyps Sister   . Pancreatic cancer Sister   . Breast cancer Sister   . Colon cancer Neg Hx   . Esophageal cancer Neg Hx   . Rectal cancer Neg Hx   . Stomach cancer Neg Hx     Current Outpatient Medications (Endocrine & Metabolic):  .  levothyroxine (LEVOXYL) 75 MCG tablet, Take 75 mcg by mouth daily.  Current Outpatient Medications (Cardiovascular):  .  lisinopril (ZESTRIL) 2.5 MG tablet, Take 1 tablet by mouth once daily .  metoprolol succinate (TOPROL-XL) 25 MG 24 hr tablet, Take 1 tablet by mouth once daily .  rosuvastatin (CRESTOR) 5 MG tablet, Take 5 mg by mouth every evening.   Current Outpatient Medications (Respiratory):  .  cetirizine (ZYRTEC) 10 MG tablet, Take 10 mg by mouth daily. Pt takes generic form of Zyrtec  Current Outpatient Medications (Analgesics):  .  acetaminophen (TYLENOL) 650 MG CR tablet, Take by mouth.  Current Outpatient Medications (Hematological):  Marland Kitchen  Cyanocobalamin (VITAMIN B12 PO), Take by mouth.  Current Outpatient Medications (Other):  Marland Kitchen  Calcium Carbonate-Vitamin D (CALTRATE 600+D PO), Take 1 tablet by mouth 2 (two) times daily.  .  Cholecalciferol (VITAMIN D3) 1000 UNITS CAPS, Take 2,000 mg by mouth daily. Marland Kitchen  FLUoxetine (PROZAC) 10 MG capsule, Take 10 mg by mouth daily. .  hydrocortisone (ANUSOL-HC) 2.5 % rectal cream, Place 1 application rectally 2 (two) times daily. (Patient taking differently: Place 1 application rectally as needed. ) .  lansoprazole (PREVACID) 30 MG capsule, Take 1 capsule (30 mg total) by mouth daily. .  Multiple Vitamin  (MULITIVITAMIN WITH MINERALS) TABS, Take 1 tablet by mouth every evening.  .  Omega-3 Fatty Acids (FISH OIL) 1000 MG CAPS, Take 1 capsule by mouth 3 (three) times daily.  .  pantoprazole (PROTONIX) 40 MG tablet, Take 1 tablet by mouth once daily .  timolol (TIMOPTIC) 0.5 % ophthalmic solution, Place 1 drop into both eyes daily. .  Turmeric (QC TUMERIC COMPLEX) 500 MG CAPS, Take by mouth. .  gabapentin (NEURONTIN) 100 MG capsule, Take 1 capsule (100 mg total) by mouth at bedtime. Marland Kitchen  tiZANidine (ZANAFLEX) 4 MG tablet,  Take 1 tablet (4 mg total) by mouth at bedtime.   Reviewed prior external information including notes and imaging from  primary care provider As well as notes that were available from care everywhere and other healthcare systems.  Past medical history, social, surgical and family history all reviewed in electronic medical record.  No pertanent information unless stated regarding to the chief complaint.   Review of Systems:  No headache, visual changes, nausea, vomiting, diarrhea, constipation, dizziness, abdominal pain, skin rash, fevers, chills, night sweats, weight loss, swollen lymph nodes,  joint swelling, chest pain, shortness of breath, mood changes. POSITIVE muscle aches, body aches  Objective  Blood pressure 120/80, pulse 63, height 5\' 1"  (1.549 m), weight 184 lb (83.5 kg), SpO2 94 %.   General: No apparent distress alert and oriented x3 mood and affect normal, dressed appropriately.  Mildly overweight HEENT: Pupils equal, extraocular movements intact  Respiratory: Patient's speak in full sentences and does not appear short of breath  Cardiovascular: No lower extremity edema, non tender, no erythema  Neuro: Cranial nerves II through XII are intact, neurovascularly intact in all extremities with 2+ DTRs and 2+ pulses.  Gait antalgic MSK: Neck exam does have some mild loss of lordosis.  Tender to palpation diffusely.  Bilateral shoulders do have some mild scapular  dyskinesis bilaterally.  Patient does have very mild impingement.  Extremities though have 4+ out of 5 strength of the rotator cuff.  Patient appears to have some paleness noted of the nailbeds of the extremities noted.  Patient is low back exam does have significant tightness noted of the lumbar spine.  Unable to do [place] secondary to tightness and body habitus.  Straight leg test potential some mild radicular symptoms on the left leg at 25 degrees of forward flexion.  Neurovascularly intact distally.  [id number]; 15 additional minutes spent for Therapeutic exercises as stated in above notes.  This included exercises focusing on stretching, strengthening, with significant focus on eccentric aspects.   Long term goals include an improvement in range of motion, strength, endurance as well as avoiding reinjury. Patient's frequency would include in 1-2 times a day, 3-5 times a week for a duration of 6-12 weeks. Exercises that included:  Basic scapular stabilization to include adduction and depression of scapula [place], focusing on proper movement and good control Internal and External rotation utilizing a theraband, with elbow tucked at side entire time Rows with theraband   Proper technique shown and discussed handout in great detail with ATC.  All questions were discussed and answered.     Impression and Recommendations:     The above documentation has been reviewed and is accurate and complete Lyndal Pulley, DO       Note: This dictation was prepared with Dragon dictation along with smaller phrase technology. Any transcriptional errors that result from this process are unintentional.

## 2019-10-17 ENCOUNTER — Encounter: Payer: Self-pay | Admitting: Family Medicine

## 2019-10-17 DIAGNOSIS — M5030 Other cervical disc degeneration, unspecified cervical region: Secondary | ICD-10-CM | POA: Insufficient documentation

## 2019-10-17 DIAGNOSIS — M5136 Other intervertebral disc degeneration, lumbar region: Secondary | ICD-10-CM | POA: Insufficient documentation

## 2019-10-17 NOTE — Assessment & Plan Note (Signed)
I think more likely contributing to the shoulder pain at the moment.  Gabapentin given today.  Zanaflex as well.  Work with Product/process development scientist follow-up again in 6 to 5 weeks

## 2019-10-17 NOTE — Assessment & Plan Note (Signed)
Likely moderate to severe.  Discussed home exercises, core stability, consider gabapentin as well as Zanaflex ordered.  X-rays pending of the neck lumbar and shoulder today.  Discussed over-the-counter medications with patient wanted to try to be more natural.  Home exercises given from athletic trainer today follow-up again in 6 weeks worsening pain consider formal physical therapy

## 2019-11-27 ENCOUNTER — Encounter: Payer: Self-pay | Admitting: Family Medicine

## 2019-11-27 ENCOUNTER — Other Ambulatory Visit: Payer: Self-pay

## 2019-11-27 ENCOUNTER — Ambulatory Visit (INDEPENDENT_AMBULATORY_CARE_PROVIDER_SITE_OTHER): Payer: Medicare HMO | Admitting: Family Medicine

## 2019-11-27 DIAGNOSIS — M5030 Other cervical disc degeneration, unspecified cervical region: Secondary | ICD-10-CM | POA: Diagnosis not present

## 2019-11-27 DIAGNOSIS — M5136 Other intervertebral disc degeneration, lumbar region: Secondary | ICD-10-CM

## 2019-11-27 MED ORDER — GABAPENTIN 100 MG PO CAPS: 200.0000 mg | ORAL_CAPSULE | Freq: Every day | ORAL | 3 refills | Status: DC

## 2019-11-27 NOTE — Assessment & Plan Note (Signed)
Stable at the moment.  No significant increase in activity.  Will not make any other strides at that point increasing gabapentin at this time

## 2019-11-27 NOTE — Assessment & Plan Note (Signed)
Patient does have degenerative disc disease.  Discussed with patient in great length.  Patient has made improvement but does have severe arthritic changes especially at the C6-C7 level.  Increase gabapentin to 200 mg and see how patient responds warned potential side effects including somnolence.  We discussed the possibility of advanced imaging which patient declined.  Patient would not want to do any epidurals of the neck at the moment.  Do not feel patient is a candidate for anything such as manipulation.  Could do more formal physical therapy at some point.  Follow-up with me again in 6 to 8 weeks

## 2019-11-27 NOTE — Patient Instructions (Addendum)
Good to see you I am glad you are feeling a little better  Gabapentin 200 mg Keep icing and exercising Work on the weight loss 650 mg of tylenol 2 times a day See me again in 6-8 weeks

## 2019-11-27 NOTE — Progress Notes (Signed)
[facility] [address] [place] Phone: [phone] Subjective:   I [name] am serving as a Education administrator for Dr. Hulan Saas.  This visit occurred during the SARS-CoV-2 public health emergency.  Safety protocols were in place, including screening questions prior to the visit, additional usage of staff PPE, and extensive cleaning of exam room while observing appropriate contact time as indicated for disinfecting solutions.   I'm seeing this patient by the request  of:  Aura Dials, PA-C  CC: Neck and back pain follow-up  CNO:[id number]  Beth Sawyer is a 69 y.o. female coming in with complaint of bilateral shoulder and back pain. Patient states she is in pain today. Pain below her shoulder blades. States the right arm goes numb.    X-rays of patient cervical spine and lumbar spine were independently visualized by me.  Patient does have multilevel degenerative disc disease of the cervical spine with severe near complete loss at C6-C7 and good disc space Patient did have left shoulder x-rays at the same time that only showed some mild arthritic changes of the glenohumeral joint and the acromioclavicular  Low back x-rays only show mild degenerative disc disease mostly at L3-L4 and L5-S1  Past Medical History:  Diagnosis Date  . Allergy    seasonal  . Anxiety   . Arthritis    neck, back and legs   . Benign essential HTN 03/30/2014  . Borderline diabetes    diet controlled  . Cataract    bilateral removed  . GERD (gastroesophageal reflux disease)   . Glaucoma   . Hyperlipidemia   . Hypertension   . OSA (obstructive sleep apnea) 02/28/2016  . PUD (peptic ulcer disease) 03/30/2014  . Sleep apnea    wear cpap  . Thyroid disease    Past Surgical History:  Procedure Laterality Date  . BREAST BIOPSY Right 1986  . BREAST EXCISIONAL BIOPSY Right    benign  . CATARACT EXTRACTION Right 2014  . CATARACT EXTRACTION Left 02/28/2014  .  [place]   x3  . COLONOSCOPY    . DILATION AND CURETTAGE OF UTERUS     X3  . HEMORRHOID BANDING    . HYSTEROSCOPY WITH D & C N/A 05/10/2012   Procedure: DILATATION AND CURETTAGE /HYSTEROSCOPY;  Surgeon: Gus Height, MD;  Location: [facility];  Service: Gynecology;  Laterality: N/A;  . POLYPECTOMY     Social History   Socioeconomic History  . Marital status: Widowed    Spouse name: Not on file  . Number of children: 3  . Years of education: Not on file  . Highest education level: Not on file  Occupational History  . Occupation: caregiver  Tobacco Use  . Smoking status: Never Smoker  . Smokeless tobacco: Never Used  Vaping Use  . Vaping Use: Never used  Substance and Sexual Activity  . Alcohol use: No    Comment: social/rare  . Drug use: No  . Sexual activity: Not on file  Other Topics Concern  . Not on file  Social History Narrative  . Not on file   Social Determinants of Health   Financial Resource Strain:   . Difficulty of Paying Living Expenses: Not on file  Food Insecurity:   . Worried About Charity fundraiser in the Last Year: Not on file  . Ran Out of Food in the Last Year: Not on file  Transportation Needs:   .  Lack of Transportation (Medical): Not on file  . Lack of Transportation (Non-Medical): Not on file  Physical Activity:   . Days of Exercise per Week: Not on file  . Minutes of Exercise per Session: Not on file  Stress:   . Feeling of Stress : Not on file  Social Connections:   . Frequency of Communication with Friends and Family: Not on file  . Frequency of Social Gatherings with Friends and Family: Not on file  . Attends Religious Services: Not on file  . Active Member of Clubs or Organizations: Not on file  . Attends Archivist Meetings: Not on file  . Marital Status: Not on file   Allergies  Allergen Reactions  . Morphine And Related Nausea And Vomiting  . Codeine Nausea And Vomiting    "made me real hot"  .  Oxycodone Nausea And Vomiting  . Tramadol Nausea And Vomiting  . Tape Rash   Family History  Problem Relation Age of Onset  . Heart Problems Mother   . Emphysema Sister   . Asthma Sister   . COPD Sister   . Heart disease Brother   . Heart disease Sister   . Congestive Heart Failure Sister   . Breast cancer Sister   . Brain cancer Sister   . Colon polyps Sister   . Pancreatic cancer Sister   . Breast cancer Sister   . Colon cancer Neg Hx   . Esophageal cancer Neg Hx   . Rectal cancer Neg Hx   . Stomach cancer Neg Hx     Current Outpatient Medications (Endocrine & Metabolic):  .  levothyroxine (LEVOXYL) 75 MCG tablet, Take 75 mcg by mouth daily.  Current Outpatient Medications (Cardiovascular):  .  lisinopril (ZESTRIL) 2.5 MG tablet, Take 1 tablet by mouth once daily .  metoprolol succinate (TOPROL-XL) 25 MG 24 hr tablet, Take 1 tablet by mouth once daily .  rosuvastatin (CRESTOR) 5 MG tablet, Take 5 mg by mouth every evening.   Current Outpatient Medications (Respiratory):  .  cetirizine (ZYRTEC) 10 MG tablet, Take 10 mg by mouth daily. Pt takes generic form of Zyrtec  Current Outpatient Medications (Analgesics):  .  acetaminophen (TYLENOL) 650 MG CR tablet, Take by mouth.  Current Outpatient Medications (Hematological):  Marland Kitchen  Cyanocobalamin (VITAMIN B12 PO), Take by mouth.  Current Outpatient Medications (Other):  Marland Kitchen  Calcium Carbonate-Vitamin D (CALTRATE 600+D PO), Take 1 tablet by mouth 2 (two) times daily.  .  Cholecalciferol (VITAMIN D3) 1000 UNITS CAPS, Take 2,000 mg by mouth daily. Marland Kitchen  FLUoxetine (PROZAC) 10 MG capsule, Take 10 mg by mouth daily. .  hydrocortisone (ANUSOL-HC) 2.5 % rectal cream, Place 1 application rectally 2 (two) times daily. (Patient taking differently: Place 1 application rectally as needed. ) .  lansoprazole (PREVACID) 30 MG capsule, Take 1 capsule (30 mg total) by mouth daily. .  Multiple Vitamin (MULITIVITAMIN WITH MINERALS) TABS, Take 1 tablet  by mouth every evening.  .  Omega-3 Fatty Acids (FISH OIL) 1000 MG CAPS, Take 1 capsule by mouth 3 (three) times daily.  .  pantoprazole (PROTONIX) 40 MG tablet, Take 1 tablet by mouth once daily .  timolol (TIMOPTIC) 0.5 % ophthalmic solution, Place 1 drop into both eyes daily. Marland Kitchen  tiZANidine (ZANAFLEX) 4 MG tablet, Take 1 tablet (4 mg total) by mouth at bedtime. .  Turmeric (QC TUMERIC COMPLEX) 500 MG CAPS, Take by mouth. .  gabapentin (NEURONTIN) 100 MG capsule, Take 2 capsules (200  mg total) by mouth at bedtime.   Reviewed prior external information including notes and imaging from  primary care provider As well as notes that were available from care everywhere and other healthcare systems.  Past medical history, social, surgical and family history all reviewed in electronic medical record.  No pertanent information unless stated regarding to the chief complaint.   Review of Systems:  No headache, visual changes, nausea, vomiting, diarrhea, constipation, dizziness, abdominal pain, skin rash, fevers, chills, night sweats, weight loss, swollen lymph nodes, body aches, joint swelling, chest pain, shortness of breath, mood changes. POSITIVE muscle aches  Objective  Blood pressure 130/80, pulse 63, height 5\' 1"  (1.549 m), weight 185 lb (83.9 kg), SpO2 93 %.   General: No apparent distress alert and oriented x3 mood and affect normal, dressed appropriately.  HEENT: Pupils equal, extraocular movements intact  Respiratory: Patient's speak in full sentences and does not appear short of breath   Gait normal with good balance and coordination.  MSK: Arthritic changes of multiple joints neck exam does have loss of lordosis, only has 5 degrees of extension.  Crepitus noted.  Minimal sidebending to the right.  Positive radicular symptoms in the C7 and C8 distribution on the right side.    Impression and Recommendations:     The above documentation has been reviewed and is accurate and complete  Lyndal Pulley, DO

## 2019-11-28 ENCOUNTER — Other Ambulatory Visit: Payer: Self-pay

## 2019-11-28 ENCOUNTER — Ambulatory Visit
Admission: RE | Admit: 2019-11-28 | Discharge: 2019-11-28 | Disposition: A | Discharge status: Home / Self Care | Payer: Medicare HMO | Source: Ambulatory Visit | Attending: Physician Assistant | Admitting: Physician Assistant

## 2019-11-28 DIAGNOSIS — Z1231 Encounter for screening mammogram for malignant neoplasm of breast: Secondary | ICD-10-CM

## 2019-12-10 ENCOUNTER — Other Ambulatory Visit: Payer: Self-pay | Admitting: Family Medicine

## 2019-12-10 NOTE — Progress Notes (Signed)
Cardiology Office Note:   Date:  12/11/2019  NAME:  Beth Sawyer    MRN: [id number] DOB:  [birth year]   PCP:  Aura Dials, PA-C  Cardiologist:  No primary care provider on file.   Referring MD: Chesley Noon, MD   Chief Complaint  Patient presents with  . Chest Pain   History of Present Illness:   Beth Sawyer is a 69 y.o. female with a hx of HTN, OSA, obesity who is being seen today for the evaluation of chest pain/SOB at the request of Aura Dials, PA-C. Evaluated in 2013 for CP. Normal SPECT and echo. She reports for the last 2 to 3 months she has had exertional shortness of breath and episodic chest pain.  She reports she can get pressure in her chest anytime.  Can occur at rest or with stress.  Last seconds to minutes.  Not alleviated by rest.  She is not tried any nitroglycerin.  Described as central chest pressure.  She also reports she gets shortness of breath with activity.  Blood pressures also been a bit elevated.  140/98 today.  She is on metoprolol for tremor as well as losartan 50 mg daily.  She reports she does get some lower extremity edema at the end of the day.  She has no overt edema on exam today.  Her EKG today demonstrates normal sinus rhythm with low voltage changes.  Her most recent lipid profile shows LDL cholesterol 58.  She is never had a heart attack or stroke.  She is a never smoker.  She does have a strong family history of heart disease in her mother and brother.  She is quite concerned about her heart health.  She is obese with a BMI of 35.  She has sleep apnea.  She uses her machine.  She does not exercise routinely.  She reports chronic back pain is the main issue.  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  Doing strenuous activity has been quite bothersome lately.  She reports she watches her salt intake.  She is on a strict diet.  She is not been able to lose any weight recently.  Problem List 1. HTN 2. Obesity -BMI 35 -A1c  6.1 -T chol 146, LDL 58, HDL 65, TG 131 -TSH 2.01 3. OSA  Past Medical History: Past Medical History:  Diagnosis Date  . Allergy    seasonal  . Anxiety   . Arthritis    neck, back and legs   . Benign essential HTN 03/30/2014  . Borderline diabetes    diet controlled  . Cataract    bilateral removed  . GERD (gastroesophageal reflux disease)   . Glaucoma   . Hyperlipidemia   . Hypertension   . OSA (obstructive sleep apnea) 02/28/2016  . PUD (peptic ulcer disease) 03/30/2014  . Sleep apnea    wear cpap  . Thyroid disease     Past Surgical History: Past Surgical History:  Procedure Laterality Date  . BREAST BIOPSY Right 1986  . BREAST EXCISIONAL BIOPSY Right    benign  . CATARACT EXTRACTION Right 2014  . CATARACT EXTRACTION Left 02/28/2014  . [place]   x3  . COLONOSCOPY    . DILATION AND CURETTAGE OF UTERUS     X3  . HEMORRHOID BANDING    . HYSTEROSCOPY WITH D & C N/A 05/10/2012   Procedure: DILATATION AND CURETTAGE /HYSTEROSCOPY;  Surgeon: Gus Height, MD;  Location:  [facility];  Service: Gynecology;  Laterality: N/A;  . POLYPECTOMY      Current Medications: Current Meds  Medication Sig  . acetaminophen (TYLENOL) 650 MG CR tablet Take by mouth.  . Calcium Carbonate-Vitamin D (CALTRATE 600+D PO) Take 1 tablet by mouth 2 (two) times daily.   . cetirizine (ZYRTEC) 10 MG tablet Take 10 mg by mouth daily. Pt takes generic form of Zyrtec  . Cholecalciferol (VITAMIN D3) 1000 UNITS CAPS Take 2,000 mg by mouth daily.  . Cyanocobalamin (VITAMIN B12 PO) Take by mouth.  Marland Kitchen FLUoxetine (PROZAC) 10 MG capsule Take 10 mg by mouth daily.  Marland Kitchen gabapentin (NEURONTIN) 100 MG capsule Take 2 capsules (200 mg total) by mouth at bedtime.  . hydrocortisone (ANUSOL-HC) 2.5 % rectal cream Place 1 application rectally 2 (two) times daily. (Patient taking differently: Place 1 application rectally as needed. )  . lansoprazole (PREVACID) 30 MG capsule Take 1 capsule (30 mg total)  by mouth daily.  Marland Kitchen levothyroxine (LEVOXYL) 75 MCG tablet Take 75 mcg by mouth daily.  Marland Kitchen losartan (COZAAR) 25 MG tablet Take 50 mg by mouth daily.   . metoprolol succinate (TOPROL-XL) 25 MG 24 hr tablet Take 1 tablet by mouth once daily  . Multiple Vitamin (MULITIVITAMIN WITH MINERALS) TABS Take 1 tablet by mouth every evening.   . nystatin cream (MYCOSTATIN) nystatin 100,000 unit/gram topical cream  APPLY CREAM TOPICALLY TO AFFECTED AREA TWICE DAILY  . Omega-3 Fatty Acids (FISH OIL) 1000 MG CAPS Take 1 capsule by mouth 3 (three) times daily.   . pantoprazole (PROTONIX) 40 MG tablet Take 1 tablet by mouth once daily  . rosuvastatin (CRESTOR) 5 MG tablet Take 5 mg by mouth every evening.   . timolol (TIMOPTIC) 0.5 % ophthalmic solution Place 1 drop into both eyes daily.  Marland Kitchen tiZANidine (ZANAFLEX) 4 MG tablet TAKE 1 TABLET BY MOUTH AT BEDTIME  . Turmeric (QC TUMERIC COMPLEX) 500 MG CAPS Take by mouth.     Allergies:    Morphine and related, Codeine, Oxycodone, Tramadol, and Tape   Social History: Social History   Socioeconomic History  . Marital status: Widowed    Spouse name: Not on file  . Number of children: 3  . Years of education: Not on file  . Highest education level: Not on file  Occupational History  . Occupation: caregiver  Tobacco Use  . Smoking status: Never Smoker  . Smokeless tobacco: Never Used  Vaping Use  . Vaping Use: Never used  Substance and Sexual Activity  . Alcohol use: No    Comment: social/rare  . Drug use: No  . Sexual activity: Not on file  Other Topics Concern  . Not on file  Social History Narrative  . Not on file   Social Determinants of Health   Financial Resource Strain:   . Difficulty of Paying Living Expenses: Not on file  Food Insecurity:   . Worried About Charity fundraiser in the Last Year: Not on file  . Ran Out of Food in the Last Year: Not on file  Transportation Needs:   . Lack of Transportation (Medical): Not on file  . Lack  of Transportation (Non-Medical): Not on file  Physical Activity:   . Days of Exercise per Week: Not on file  . Minutes of Exercise per Session: Not on file  Stress:   . Feeling of Stress : Not on file  Social Connections:   . Frequency of Communication with Friends and Family: Not  on file  . Frequency of Social Gatherings with Friends and Family: Not on file  . Attends Religious Services: Not on file  . Active Member of Clubs or Organizations: Not on file  . Attends Archivist Meetings: Not on file  . Marital Status: Not on file     Family History: The patient's family history includes Asthma in her sister; Brain cancer in her sister; Breast cancer in her sister and sister; COPD in her sister; Colon polyps in her sister; Congestive Heart Failure in her sister; Emphysema in her sister; Heart Problems in her mother; Heart disease in her brother and sister; Pancreatic cancer in her sister. There is no history of Colon cancer, Esophageal cancer, Rectal cancer, or Stomach cancer.  ROS:   All other ROS reviewed and negative. Pertinent positives noted in the HPI.     EKGs/Labs/Other Studies Reviewed:   The following studies were personally reviewed by me today:  EKG:  EKG is ordered today.  The ekg ordered today demonstrates sinus bradycardia, heart rate 58, no acute ischemic changes, no evidence of prior infarction, and was personally reviewed by me.   Recent Labs: No results found for requested labs within last 8760 hours.   Recent Lipid Panel No results found for: CHOL, TRIG, HDL, CHOLHDL, VLDL, LDLCALC, LDLDIRECT  Physical Exam:   VS:  BP (!) 140/98   Pulse (!) 59   Ht 5' 1"  (1.549 m)   Wt 183 lb 9.6 oz (83.3 kg)   SpO2 93%   BMI 34.69 kg/m    Wt Readings from Last 3 Encounters:  12/11/19 183 lb 9.6 oz (83.3 kg)  11/27/19 185 lb (83.9 kg)  10/16/19 184 lb (83.5 kg)    General: Well nourished, well developed, in no acute distress Heart: Atraumatic, normal size   Eyes: PEERLA, EOMI  Neck: Supple, no JVD Endocrine: No thryomegaly Cardiac: Normal S1, S2; RRR; no murmurs, rubs, or gallops Lungs: Clear to auscultation bilaterally, no wheezing, rhonchi or rales  Abd: Soft, nontender, no hepatomegaly  Ext: No edema, pulses 2+ Musculoskeletal: No deformities, BUE and BLE strength normal and equal Skin: Warm and dry, no rashes   Neuro: Alert and oriented to person, place, time, and situation, CNII-XII grossly intact, no focal deficits  Psych: Normal mood and affect   ASSESSMENT:   Beth Sawyer is a 69 y.o. female who presents for the following: 1. Chest pain of uncertain etiology   2. SOB (shortness of breath)   3. Primary hypertension   4. Family history of heart disease    PLAN:   1. Chest pain of uncertain etiology -Atypical chest pain.  EKG without acute changes.  CVD risk factors include obesity, hypertension, strong family history, OSA.  I recommended coronary CTA for further evaluation.  She will give [place] a kidney panel today.  She will take her metoprolol home dose 2 hours before the scan.  She is unable to exercise on a treadmill due to her back.  I think a CTA is the best test for her.  2. SOB (shortness of breath) -Suspect this is deconditioning and obesity related.  No evidence of heart failure on exam.  EKG without acute changes.  We will obtain a BNP.  We will also obtain an echocardiogram.  Coronary CTA for CAD evaluation as above.  3. Primary hypertension -Blood pressure elevated despite metoprolol and losartan 50 mg daily.  I think she just needs a bit of a diuretic.  We will start  HCTZ 25 mg daily.  Disposition: Return in about 3 months (around 03/12/2020).  Medication Adjustments/Labs and Tests Ordered: Current medicines are reviewed at length with the patient today.  Concerns regarding medicines are outlined above.  Orders Placed This Encounter  Procedures  . CT CORONARY MORPH W/CTA COR W/SCORE W/CA W/CM &/OR WO/CM  . CT  CORONARY FRACTIONAL FLOW RESERVE DATA PREP  . CT CORONARY FRACTIONAL FLOW RESERVE FLUID ANALYSIS  . Basic metabolic panel  . B Nat Peptide  . EKG 12-Lead  . ECHOCARDIOGRAM COMPLETE   Meds ordered this encounter  Medications  . hydrochlorothiazide (HYDRODIURIL) 25 MG tablet    Sig: Take 1 tablet (25 mg total) by mouth daily.    Dispense:  90 tablet    Refill:  3    Patient Instructions  Medication Instructions:  START hydrochlorothiazide (HCTZ) 25 mg daily *If you need a refill on your cardiac medications before your next appointment, please call your pharmacy*   Lab Work: BMET, BNP If you have labs (blood work) drawn today and your tests are completely normal, you will receive your results only by: Marland Kitchen MyChart Message (if you have MyChart) OR . A paper copy in the mail If you have any lab test that is abnormal or we need to change your treatment, we will call you to review the results.   Testing/Procedures: Your physician has requested that you have an echocardiogram. Echocardiography is a painless test that uses sound waves to create images of your heart. It provides your doctor with information about the size and shape of your heart and how well your heart's chambers and valves are working. This procedure takes approximately one hour. There are no restrictions for this procedure. This test is completed at [address] physician has requested that you have a cardiac CT:  Your cardiac CT will be scheduled at one of the below locations:   [facility] [address] [place], [place] [address] (336) [place] [address] [place], [place] [address] [phone]  If scheduled at [facility], please arrive at the [facility] main entrance of [facility] 30 minutes prior to test start time. Proceed to the [facility] Radiology Department (first floor) to check-in and test  prep.  If scheduled at [facility], please arrive 15 mins early for check-in and test prep.  Please follow these instructions carefully (unless otherwise directed):  Hold all erectile dysfunction medications at least 3 days (72 hrs) prior to test.  On the Night Before the Test: . Be sure to Drink plenty of water. . Do not consume any caffeinated/decaffeinated beverages or chocolate 12 hours prior to your test. . Do not take any antihistamines 12 hours prior to your test. . If the patient has contrast allergy: ? Patient will need a prescription for Prednisone and very clear instructions (as follows): 1. Prednisone 50 mg - take 13 hours prior to test 2. Take another Prednisone 50 mg 7 hours prior to test 3. Take another Prednisone 50 mg 1 hour prior to test 4. Take Benadryl 50 mg 1 hour prior to test . Patient must complete all four doses of above prophylactic medications. . Patient will need a ride after test due to Benadryl.  On the Day of the Test: . Drink plenty of water. Do not drink any water within one hour of the test. . Do not eat any food  4 hours prior to the test. . You may take your regular medications prior to the test.  . Take your normal dose of metoprolol (Lopressor) two hours prior to test. . HOLD Furosemide/Hydrochlorothiazide morning of the test. . FEMALES- please wear underwire-free bra if available       After the Test: . Drink plenty of water. . After receiving IV contrast, you may experience a mild flushed feeling. This is normal. . On occasion, you may experience a mild rash up to 24 hours after the test. This is not dangerous. If this occurs, you can take Benadryl 25 mg and increase your fluid intake. . If you experience trouble breathing, this can be serious. If it is severe call 911 IMMEDIATELY. If it is mild, please call our office. . If you take any of these medications: Glipizide/Metformin, Avandament, Glucavance, please do not  take 48 hours after completing test unless otherwise instructed.   Once we have confirmed authorization from your insurance company, we will call you to set up a date and time for your test. Based on how quickly your insurance processes prior authorizations requests, please allow up to 4 weeks to be contacted for scheduling your Cardiac CT appointment. Be advised that routine Cardiac CT appointments could be scheduled as many as 8 weeks after your provider has ordered it.  For non-scheduling related questions, please contact the cardiac imaging nurse navigator should you have any questions/concerns: Marchia Bond, Cardiac Imaging Nurse Navigator Burley Saver, Interim Cardiac Imaging Nurse [place] [facility] Direct Office Dial: [phone]   For scheduling needs, including cancellations and rescheduling, please call Vivien Rota at [phone], option 3.    Follow-Up: At [facility], you and your health needs are our priority.  As part of our continuing mission to provide you with exceptional heart care, we have created designated Provider Care Teams.  These Care Teams include your primary Cardiologist (physician) and Advanced Practice Providers (APPs -  Physician Assistants and Nurse Practitioners) who all work together to provide you with the care you need, when you need it.  We recommend signing up for the patient portal called "MyChart".  Sign up information is provided on this After Visit Summary.  MyChart is used to connect with patients for Virtual Visits (Telemedicine).  Patients are able to view lab/test results, encounter notes, upcoming appointments, etc.  Non-urgent messages can be sent to your provider as well.   To learn more about what you can do with MyChart, go to [email or web address].    Your next appointment:   3 month(s)  The format for your next appointment:   In Person  Provider:   Eleonore Chiquito, MD   Other Instructions None      Signed, Addison Naegeli. Audie Box, [place]  [address] [place], [place] [address] [phone]  12/11/2019 4:51 PM

## 2019-12-11 ENCOUNTER — Ambulatory Visit: Payer: Medicare HMO | Admitting: Cardiovascular Disease

## 2019-12-11 ENCOUNTER — Other Ambulatory Visit: Payer: Self-pay

## 2019-12-11 ENCOUNTER — Encounter: Payer: Self-pay | Admitting: Cardiovascular Disease

## 2019-12-11 VITALS — BP 140/98 | HR 59 | Ht 61.0 in | Wt 183.6 lb

## 2019-12-11 DIAGNOSIS — R079 Chest pain, unspecified: Secondary | ICD-10-CM

## 2019-12-11 DIAGNOSIS — R0602 Shortness of breath: Secondary | ICD-10-CM

## 2019-12-11 DIAGNOSIS — Z8249 Family history of ischemic heart disease and other diseases of the circulatory system: Secondary | ICD-10-CM | POA: Diagnosis not present

## 2019-12-11 DIAGNOSIS — I10 Essential (primary) hypertension: Secondary | ICD-10-CM

## 2019-12-11 MED ORDER — HYDROCHLOROTHIAZIDE 25 MG PO TABS: 25.0000 mg | ORAL_TABLET | Freq: Every day | ORAL | 3 refills | Status: DC

## 2019-12-11 NOTE — Patient Instructions (Signed)
Medication Instructions:  START hydrochlorothiazide (HCTZ) 25 mg daily *If you need a refill on your cardiac medications before your next appointment, please call your pharmacy*   Lab Work: BMET, BNP If you have labs (blood work) drawn today and your tests are completely normal, you will receive your results only by: Marland Kitchen MyChart Message (if you have MyChart) OR . A paper copy in the mail If you have any lab test that is abnormal or we need to change your treatment, we will call you to review the results.   Testing/Procedures: Your physician has requested that you have an echocardiogram. Echocardiography is a painless test that uses sound waves to create images of your heart. It provides your doctor with information about the size and shape of your heart and how well your heart's chambers and valves are working. This procedure takes approximately one hour. There are no restrictions for this procedure. This test is completed at [address] physician has requested that you have a cardiac CT:  Your cardiac CT will be scheduled at one of the below locations:   [facility] [address] [place], [place] [address] (336) [place] [address] [place], [place] [address] [phone]  If scheduled at [facility], please arrive at the [facility] main entrance of [facility] 30 minutes prior to test start time. Proceed to the [facility] Radiology Department (first floor) to check-in and test prep.  If scheduled at [facility], please arrive 15 mins early for check-in and test prep.  Please follow these instructions carefully (unless otherwise directed):  Hold all erectile dysfunction medications at least 3 days (72 hrs) prior to test.  On the Night Before the Test: . Be sure to Drink plenty of water. . Do not consume any caffeinated/decaffeinated beverages  or chocolate 12 hours prior to your test. . Do not take any antihistamines 12 hours prior to your test. . If the patient has contrast allergy: ? Patient will need a prescription for Prednisone and very clear instructions (as follows): 1. Prednisone 50 mg - take 13 hours prior to test 2. Take another Prednisone 50 mg 7 hours prior to test 3. Take another Prednisone 50 mg 1 hour prior to test 4. Take Benadryl 50 mg 1 hour prior to test . Patient must complete all four doses of above prophylactic medications. . Patient will need a ride after test due to Benadryl.  On the Day of the Test: . Drink plenty of water. Do not drink any water within one hour of the test. . Do not eat any food 4 hours prior to the test. . You may take your regular medications prior to the test.  . Take your normal dose of metoprolol (Lopressor) two hours prior to test. . HOLD Furosemide/Hydrochlorothiazide morning of the test. . FEMALES- please wear underwire-free bra if available       After the Test: . Drink plenty of water. . After receiving IV contrast, you may experience a mild flushed feeling. This is normal. . On occasion, you may experience a mild rash up to 24 hours after the test. This is not dangerous. If this occurs, you can take Benadryl 25 mg and increase your fluid intake. . If you experience trouble breathing, this can be serious. If it is severe call 911 IMMEDIATELY. If it is mild, please call our office. . If you take any  of these medications: Glipizide/Metformin, Avandament, Glucavance, please do not take 48 hours after completing test unless otherwise instructed.   Once we have confirmed authorization from your insurance company, we will call you to set up a date and time for your test. Based on how quickly your insurance processes prior authorizations requests, please allow up to 4 weeks to be contacted for scheduling your Cardiac CT appointment. Be advised that routine Cardiac CT appointments  could be scheduled as many as 8 weeks after your provider has ordered it.  For non-scheduling related questions, please contact the cardiac imaging nurse navigator should you have any questions/concerns: Marchia Bond, Cardiac Imaging Nurse Navigator Burley Saver, Interim Cardiac Imaging Nurse [place] [facility] Services Direct Office Dial: [phone]   For scheduling needs, including cancellations and rescheduling, please call Vivien Rota at [phone], option 3.    Follow-Up: At [facility], you and your health needs are our priority.  As part of our continuing mission to provide you with exceptional heart care, we have created designated Provider Care Teams.  These Care Teams include your primary Cardiologist (physician) and Advanced Practice Providers (APPs -  Physician Assistants and Nurse Practitioners) who all work together to provide you with the care you need, when you need it.  We recommend signing up for the patient portal called "MyChart".  Sign up information is provided on this After Visit Summary.  MyChart is used to connect with patients for Virtual Visits (Telemedicine).  Patients are able to view lab/test results, encounter notes, upcoming appointments, etc.  Non-urgent messages can be sent to your provider as well.   To learn more about what you can do with MyChart, go to [email or web address].    Your next appointment:   3 month(s)  The format for your next appointment:   In Person  Provider:   Eleonore Chiquito, MD   Other Instructions None

## 2019-12-12 LAB — BASIC METABOLIC PANEL
BUN/Creatinine Ratio: 12 (ref 12–28)
BUN: 11 mg/dL (ref 8–27)
CO2: 24 mmol/L (ref 20–29)
Calcium: 9.7 mg/dL (ref 8.7–10.3)
Chloride: 105 mmol/L (ref 96–106)
Creatinine, Ser: 0.91 mg/dL (ref 0.57–1.00)
GFR calc Af Amer: 74 mL/min/{1.73_m2} (ref >59)
GFR calc non Af Amer: 64 mL/min/{1.73_m2} (ref >59)
Glucose: 90 mg/dL (ref 65–99)
Potassium: 4.3 mmol/L (ref 3.5–5.2)
Sodium: 143 mmol/L (ref 134–144)

## 2019-12-12 LAB — BRAIN NATRIURETIC PEPTIDE: BNP: 53.7 pg/mL (ref 0.0–100.0)

## 2019-12-27 ENCOUNTER — Other Ambulatory Visit: Payer: Self-pay

## 2019-12-27 ENCOUNTER — Telehealth (HOSPITAL_COMMUNITY): Payer: Self-pay | Admitting: Emergency Medicine

## 2019-12-27 ENCOUNTER — Ambulatory Visit (HOSPITAL_COMMUNITY): Payer: Medicare HMO | Attending: Cardiovascular Disease

## 2019-12-27 DIAGNOSIS — R0602 Shortness of breath: Secondary | ICD-10-CM | POA: Diagnosis present

## 2019-12-27 DIAGNOSIS — R079 Chest pain, unspecified: Secondary | ICD-10-CM

## 2019-12-27 NOTE — Telephone Encounter (Signed)
Attempted to call patient regarding upcoming cardiac CT appointment. °Left message on voicemail with name and callback number °Antrone Walla RN Navigator Cardiac Imaging °[facility] °[phone] Office °[phone] Cell ° °

## 2019-12-28 ENCOUNTER — Telehealth (HOSPITAL_COMMUNITY): Payer: Self-pay | Admitting: Emergency Medicine

## 2019-12-28 NOTE — Telephone Encounter (Signed)
Pt returning phone call regarding upcoming cardiac imaging study; pt verbalizes understanding of appt date/time, parking situation and where to check in, pre-test NPO status and medications ordered, and verified current allergies; name and call back number provided for further questions should they arise Shana Younge RN Navigator Cardiac Imaging [facility] [phone] office [phone] cell   

## 2019-12-28 NOTE — Telephone Encounter (Signed)
Attempted to call patient regarding upcoming cardiac CT appointment. °Left message on voicemail with name and callback number °Kanae Ignatowski RN Navigator Cardiac Imaging °[facility] °[phone] Office °[phone] Cell ° °

## 2019-12-29 ENCOUNTER — Ambulatory Visit (HOSPITAL_COMMUNITY)
Admission: RE | Admit: 2019-12-29 | Discharge: 2019-12-29 | Disposition: A | Discharge status: Home / Self Care | Payer: Medicare HMO | Source: Ambulatory Visit | Attending: Cardiovascular Disease | Admitting: Cardiovascular Disease

## 2019-12-29 ENCOUNTER — Encounter: Payer: Self-pay | Admitting: *Deleted

## 2019-12-29 ENCOUNTER — Other Ambulatory Visit: Payer: Self-pay

## 2019-12-29 ENCOUNTER — Encounter (HOSPITAL_COMMUNITY): Payer: Self-pay

## 2019-12-29 DIAGNOSIS — Z006 Encounter for examination for normal comparison and control in clinical research program: Secondary | ICD-10-CM

## 2019-12-29 DIAGNOSIS — K449 Diaphragmatic hernia without obstruction or gangrene: Secondary | ICD-10-CM | POA: Insufficient documentation

## 2019-12-29 DIAGNOSIS — R079 Chest pain, unspecified: Secondary | ICD-10-CM | POA: Diagnosis present

## 2019-12-29 DIAGNOSIS — K760 Fatty (change of) liver, not elsewhere classified: Secondary | ICD-10-CM | POA: Insufficient documentation

## 2019-12-29 DIAGNOSIS — I2510 Atherosclerotic heart disease of native coronary artery without angina pectoris: Secondary | ICD-10-CM | POA: Insufficient documentation

## 2019-12-29 MED ORDER — NITROGLYCERIN 0.4 MG SL SUBL
SUBLINGUAL_TABLET | SUBLINGUAL | Status: AC
  Filled 2019-12-29: qty 2

## 2019-12-29 MED ORDER — IOHEXOL 350 MG/ML SOLN
80.0000 mL | Freq: Once | INTRAVENOUS | Status: AC | PRN
  Administered 2019-12-29: 80 mL via INTRAVENOUS

## 2019-12-29 NOTE — Research (Signed)
IDENTIFY Informed Consent                  Subject Name:   Beth Sawyer. Beth Sawyer   Subject met inclusion and exclusion criteria.  The informed consent form, study requirements and expectations were reviewed with the subject and questions and concerns were addressed prior to the signing of the consent form.  The subject verbalized understanding of the trial requirements.  The subject agreed to participate in the IDENTIFY trial and signed the informed consent.  The informed consent was obtained prior to performance of any protocol-specific procedures for the subject.  A copy of the signed informed consent was given to the subject and a copy was placed in the subject's medical record.   [place] Matthew Cina, Research Assistant  12/29/2019  13:33 p.m.

## 2020-01-09 ENCOUNTER — Other Ambulatory Visit: Payer: Self-pay

## 2020-01-09 ENCOUNTER — Ambulatory Visit: Payer: Medicare HMO | Admitting: Family Medicine

## 2020-01-09 ENCOUNTER — Encounter: Payer: Self-pay | Admitting: Family Medicine

## 2020-01-09 DIAGNOSIS — M5136 Other intervertebral disc degeneration, lumbar region: Secondary | ICD-10-CM | POA: Diagnosis not present

## 2020-01-09 LAB — ECHOCARDIOGRAM COMPLETE
Area-P 1/2: 2.62 cm2
S' Lateral: 2.6 cm

## 2020-01-09 MED ORDER — GABAPENTIN 100 MG PO CAPS: 200.0000 mg | ORAL_CAPSULE | Freq: Two times a day (BID) | ORAL | 3 refills | Status: DC

## 2020-01-09 NOTE — Progress Notes (Signed)
[facility] [address] [place] Phone: [phone] Subjective:   I Beth Sawyer am serving as a Education administrator for Dr. Hulan Saas.  This visit occurred during the SARS-CoV-2 public health emergency.  Safety protocols were in place, including screening questions prior to the visit, additional usage of staff PPE, and extensive cleaning of exam room while observing appropriate contact time as indicated for disinfecting solutions.   I'm seeing this patient by the request  of:  [name]  CC: neck pain follow up   [id number]   11/27/2019 Stable at the moment.  No significant increase in activity.  Will not make any other strides at that point increasing gabapentin at this time   Patient does have degenerative disc disease.  Discussed with patient in great length.  Patient has made improvement but does have severe arthritic changes especially at the C6-C7 level.  Increase gabapentin to 200 mg and see how patient responds warned potential side effects including somnolence.  We discussed the possibility of advanced imaging which patient declined.  Patient would not want to do any epidurals of the neck at the moment.  Do not feel patient is a candidate for anything such as manipulation.  Could do more formal physical therapy at some point.  Follow-up with me again in 6 to 8 weeks  Update 01/09/2020 Beth Sawyer is a 69 y.o. female coming in with complaint of neck and back pain. Patient states she is doing well. Standing for long periods of time her back is painful.    Gabapentin 200mg  was started at last exam     X-rays of patient cervical spine and lumbar spine were independently visualized by me.  Patient does have multilevel degenerative disc disease of the cervical spine with severe near complete loss at C6-C7 and good disc space Patient did have left shoulder x-rays at the same time that only showed some mild arthritic changes of  the glenohumeral joint and the acromioclavicular joint   Low back x-rays only show mild degenerative disc disease mostly at L3-L4 and L5-S1  Past Medical History:  Diagnosis Date  . Allergy    seasonal  . Anxiety   . Arthritis    neck, back and legs   . Benign essential HTN 03/30/2014  . Borderline diabetes    diet controlled  . Cataract    bilateral removed  . GERD (gastroesophageal reflux disease)   . Glaucoma   . Hyperlipidemia   . Hypertension   . OSA (obstructive sleep apnea) 02/28/2016  . PUD (peptic ulcer disease) 03/30/2014  . Sleep apnea    wear cpap  . Thyroid disease    Past Surgical History:  Procedure Laterality Date  . BREAST BIOPSY Right 1986  . BREAST EXCISIONAL BIOPSY Right    benign  . CATARACT EXTRACTION Right 2014  . CATARACT EXTRACTION Left 02/28/2014  . [place]   x3  . COLONOSCOPY    . DILATION AND CURETTAGE OF UTERUS     X3  . HEMORRHOID BANDING    . HYSTEROSCOPY WITH D & C N/A 05/10/2012   Procedure: DILATATION AND CURETTAGE /HYSTEROSCOPY;  Surgeon: Gus Height, MD;  Location: [facility];  Service: Gynecology;  Laterality: N/A;  . POLYPECTOMY     Social History   Socioeconomic History  . Marital status: Widowed    Spouse name: Not on file  . Number of children: 3  . Years of education:  Not on file  . Highest education level: Not on file  Occupational History  . Occupation: caregiver  Tobacco Use  . Smoking status: Never Smoker  . Smokeless tobacco: Never Used  Vaping Use  . Vaping Use: Never used  Substance and Sexual Activity  . Alcohol use: No    Comment: social/rare  . Drug use: No  . Sexual activity: Not on file  Other Topics Concern  . Not on file  Social History Narrative  . Not on file   Social Determinants of Health   Financial Resource Strain:   . Difficulty of Paying Living Expenses: Not on file  Food Insecurity:   . Worried About Charity fundraiser in the Last Year: Not on file  . Ran Out of  Food in the Last Year: Not on file  Transportation Needs:   . Lack of Transportation (Medical): Not on file  . Lack of Transportation (Non-Medical): Not on file  Physical Activity:   . Days of Exercise per Week: Not on file  . Minutes of Exercise per Session: Not on file  Stress:   . Feeling of Stress : Not on file  Social Connections:   . Frequency of Communication with Friends and Family: Not on file  . Frequency of Social Gatherings with Friends and Family: Not on file  . Attends Religious Services: Not on file  . Active Member of Clubs or Organizations: Not on file  . Attends Archivist Meetings: Not on file  . Marital Status: Not on file   Allergies  Allergen Reactions  . Morphine And Related Nausea And Vomiting  . Codeine Nausea And Vomiting    "made me real hot"  . Oxycodone Nausea And Vomiting  . Tramadol Nausea And Vomiting  . Tape Rash   Family History  Problem Relation Age of Onset  . Heart Problems Mother   . Emphysema Sister   . Asthma Sister   . COPD Sister   . Heart disease Brother   . Heart disease Sister   . Congestive Heart Failure Sister   . Breast cancer Sister   . Brain cancer Sister   . Colon polyps Sister   . Pancreatic cancer Sister   . Breast cancer Sister   . Colon cancer Neg Hx   . Esophageal cancer Neg Hx   . Rectal cancer Neg Hx   . Stomach cancer Neg Hx     Current Outpatient Medications (Endocrine & Metabolic):  .  levothyroxine (LEVOXYL) 75 MCG tablet, Take 75 mcg by mouth daily.  Current Outpatient Medications (Cardiovascular):  .  hydrochlorothiazide (HYDRODIURIL) 25 MG tablet, Take 1 tablet (25 mg total) by mouth daily. [facility]  losartan (COZAAR) 25 MG tablet, Take 50 mg by mouth daily.  .  metoprolol succinate (TOPROL-XL) 25 MG 24 hr tablet, Take 1 tablet by mouth once daily .  rosuvastatin (CRESTOR) 5 MG tablet, Take 5 mg by mouth every evening.   Current Outpatient Medications (Respiratory):  .  cetirizine (ZYRTEC) 10  MG tablet, Take 10 mg by mouth daily. Pt takes generic form of Zyrtec  Current Outpatient Medications (Analgesics):  .  acetaminophen (TYLENOL) 650 MG CR tablet, Take by mouth.  Current Outpatient Medications (Hematological):  [facility]  Cyanocobalamin (VITAMIN B12 PO), Take by mouth.  Current Outpatient Medications (Other):  [facility]  Calcium Carbonate-Vitamin D (CALTRATE 600+D PO), Take 1 tablet by mouth 2 (two) times daily.  .  Cholecalciferol (VITAMIN D3) 1000 UNITS CAPS, Take 2,000 mg  by mouth daily. [facility]  FLUoxetine (PROZAC) 10 MG capsule, Take 10 mg by mouth daily. [facility]  gabapentin (NEURONTIN) 100 MG capsule, Take 2 capsules (200 mg total) by mouth at bedtime. .  hydrocortisone (ANUSOL-HC) 2.5 % rectal cream, Place 1 application rectally 2 (two) times daily. (Patient taking differently: Place 1 application rectally as needed. ) .  lansoprazole (PREVACID) 30 MG capsule, Take 1 capsule (30 mg total) by mouth daily. .  Multiple Vitamin (MULITIVITAMIN WITH MINERALS) TABS, Take 1 tablet by mouth every evening.  .  nystatin cream (MYCOSTATIN), nystatin 100,000 unit/gram topical cream  APPLY CREAM TOPICALLY TO AFFECTED AREA TWICE DAILY .  Omega-3 Fatty Acids (FISH OIL) 1000 MG CAPS, Take 1 capsule by mouth 3 (three) times daily.  .  pantoprazole (PROTONIX) 40 MG tablet, Take 1 tablet by mouth once daily .  timolol (TIMOPTIC) 0.5 % ophthalmic solution, Place 1 drop into both eyes daily. [facility]  tiZANidine (ZANAFLEX) 4 MG tablet, TAKE 1 TABLET BY MOUTH AT BEDTIME .  Turmeric (QC TUMERIC COMPLEX) 500 MG CAPS, Take by mouth. .  gabapentin (NEURONTIN) 100 MG capsule, Take 2 capsules (200 mg total) by mouth 2 (two) times daily.   Reviewed prior external information including notes and imaging from  primary care provider As well as notes that were available from care everywhere and other healthcare systems.  Past medical history, social, surgical and family history all reviewed in electronic medical record.  No  pertanent information unless stated regarding to the chief complaint.   Review of Systems:  No headache, visual changes, nausea, vomiting, diarrhea, constipation, dizziness, abdominal pain, skin rash, fevers, chills, night sweats, weight loss, swollen lymph nodes, body aches, joint swelling, chest pain, shortness of breath, mood changes. POSITIVE muscle aches  Objective  Blood pressure 120/80, pulse 62, height 5\' 1"  (1.549 m), weight 180 lb (81.6 kg), SpO2 93 %.   General: No apparent distress alert and oriented x3 mood and affect normal, dressed appropriately.  HEENT: Pupils equal, extraocular movements intact  Respiratory: Patient's speak in full sentences and does not appear short of breath  Cardiovascular: No lower extremity edema, non tender, no erythema  Patient's low back still has some tenderness to palpation more in the lower lumbar section at L4-L5 and L5-S1 bilaterally right greater than left.  Tightness with right FABER test.  Tightness with straight leg test but no significant radicular symptoms.  Patient does have some mild worsening pain with any extension greater than 10 degrees of the back.  Neurovascularly intact distally.   Impression and Recommendations:     The above documentation has been reviewed and is accurate and complete Lyndal Pulley, DO

## 2020-01-09 NOTE — Patient Instructions (Addendum)
Good to see you Gabapentin 100 mg in the morning 200 mg at night You have enough to increase up to 200 mg twice a day but watch for side effects Continue everything else See me again in 2 months

## 2020-01-09 NOTE — Assessment & Plan Note (Signed)
Patient continues to have signs and symptoms consistent with more of a spinal stenosis.  Patient's x-rays only show mild degenerative disc disease but symptoms probably will cause for would be shown better on MRI.  At this point though I do not think it would change medical management with patient improving to a certain degree.  Patient does feel the gabapentin is more helpful.  Patient will start with 100 mg in the a.m. and continuing the 200 mg at night patient would be able to increase to 200 mg during the day if no significant somnolence.  Patient understands this.  Does have a very small amount of muscle relaxer if needed as well for severe pain.  Patient seems to do well and will follow up again in 2 months

## 2020-01-12 ENCOUNTER — Telehealth: Payer: Self-pay | Admitting: Family Medicine

## 2020-01-12 NOTE — Telephone Encounter (Signed)
Patient called stating that gabapentin (NEURONTIN) 100 MG capsule is needing PA.  Can you help with this?

## 2020-01-15 NOTE — Telephone Encounter (Signed)
Faxed PA to insurance

## 2020-03-12 ENCOUNTER — Ambulatory Visit: Payer: Medicare Other | Admitting: Cardiovascular Disease

## 2020-03-14 ENCOUNTER — Ambulatory Visit: Payer: Medicare HMO | Admitting: Family Medicine

## 2020-03-15 ENCOUNTER — Ambulatory Visit: Payer: Medicare HMO | Admitting: Family Medicine

## 2020-03-22 ENCOUNTER — Ambulatory Visit: Payer: Medicare Other | Admitting: Cardiovascular Disease

## 2020-04-28 NOTE — Progress Notes (Unsigned)
Cardiology Office Note:   Date:  04/29/2020  NAME:  Beth Sawyer    MRN: [id number] DOB:  05/15/1950   PCP:  Aura Dials, PA-C  Cardiologist:  No primary care provider on file.   Referring MD: Aura Dials, PA-C   Chief Complaint  Patient presents with  . Coronary Artery Disease        History of Present Illness:   Beth Sawyer is a 70 y.o. female with a hx of HTN, DM, obesity, OSA who presents for follow-up. Found to have minimal CAD on CCTA. BNP normal, 53. Echo normal. No cardiac explanation for CP/SOB.  She reports she is still getting short of breath chest pain symptoms have resolved.  She reports walking and lows or other stores can get her quite winded.  I did discuss with her that her BNP was negative.  Echo was normal.  Coronary CTA showed no evidence obstructive CAD.  I have no cardiac explanation for her symptoms.  She is a never smoker.  Overall I suspect she is just deconditioned.  We did start her on aspirin.  She is tolerating this well.  Her most recent LDL cholesterol is 58 on Crestor.  Her blood pressure is 118/62.  She could lose a little weight with a BMI of 33.  Overall her risk factors are optimized.  She denies any chest pain or shortness of breath in the office today.  Problem List 1. HTN 2. Obesity -BMI 35 -A1c 6.1 -T chol 146, LDL 58, HDL 65, TG 131 -TSH 2.01 3. OSA 4. Non-obstructive CAD -coronary calcium 53 (64rth percentile) -<25% LAD  Past Medical History: Past Medical History:  Diagnosis Date  . Allergy    seasonal  . Anxiety   . Arthritis    neck, back and legs   . Benign essential HTN 03/30/2014  . Borderline diabetes    diet controlled  . Cataract    bilateral removed  . GERD (gastroesophageal reflux disease)   . Glaucoma   . Hyperlipidemia   . Hypertension   . OSA (obstructive sleep apnea) 02/28/2016  . PUD (peptic ulcer disease) 03/30/2014  . Sleep apnea    wear cpap  . Thyroid disease     Past Surgical History: Past  Surgical History:  Procedure Laterality Date  . BREAST BIOPSY Right 1986  . BREAST EXCISIONAL BIOPSY Right    benign  . CATARACT EXTRACTION Right 2014  . CATARACT EXTRACTION Left 02/28/2014  . [place]   x3  . COLONOSCOPY    . DILATION AND CURETTAGE OF UTERUS     X3  . HEMORRHOID BANDING    . HYSTEROSCOPY WITH D & C N/A 05/10/2012   Procedure: DILATATION AND CURETTAGE /HYSTEROSCOPY;  Surgeon: Gus Height, MD;  Location: [facility];  Service: Gynecology;  Laterality: N/A;  . POLYPECTOMY      Current Medications: Current Meds  Medication Sig  . acetaminophen (TYLENOL) 650 MG CR tablet Take by mouth.  . Calcium Carbonate-Vitamin D (CALTRATE 600+D PO) Take 1 tablet by mouth 2 (two) times daily.   . cetirizine (ZYRTEC) 10 MG tablet Take 10 mg by mouth daily. Pt takes generic form of Zyrtec  . Cholecalciferol (VITAMIN D3) 1000 UNITS CAPS Take 2,000 mg by mouth daily.  . Cyanocobalamin (VITAMIN B12 PO) Take by mouth.  [facility] FLUoxetine (PROZAC) 10 MG capsule Take 10 mg by mouth daily.  [facility] gabapentin (NEURONTIN) 100 MG capsule Take 2 capsules (200  mg total) by mouth 2 (two) times daily.  . hydrocortisone (ANUSOL-HC) 2.5 % rectal cream Place 1 application rectally 2 (two) times daily. (Patient taking differently: Place 1 application rectally as needed.)  . lansoprazole (PREVACID) 30 MG capsule Take 1 capsule (30 mg total) by mouth daily.  [facility] Kitchen levothyroxine (SYNTHROID) 75 MCG tablet Take 75 mcg by mouth daily.  [facility] losartan (COZAAR) 25 MG tablet Take 50 mg by mouth daily.   . metoprolol succinate (TOPROL-XL) 25 MG 24 hr tablet Take 1 tablet by mouth once daily  . Multiple Vitamin (MULITIVITAMIN WITH MINERALS) TABS Take 1 tablet by mouth every evening.   . nystatin cream (MYCOSTATIN) nystatin 100,000 unit/gram topical cream  APPLY CREAM TOPICALLY TO AFFECTED AREA TWICE DAILY  . Omega-3 Fatty Acids (FISH OIL) 1000 MG CAPS Take 1 capsule by mouth 3 (three) times daily.   .  pantoprazole (PROTONIX) 40 MG tablet Take 1 tablet by mouth once daily  . rosuvastatin (CRESTOR) 5 MG tablet Take 5 mg by mouth every evening.   . timolol (TIMOPTIC) 0.5 % ophthalmic solution Place 1 drop into both eyes daily.  . Turmeric 500 MG CAPS Take by mouth.     Allergies:    Morphine and related, Codeine, Oxycodone, Tramadol, and Tape   Social History: Social History   Socioeconomic History  . Marital status: Widowed    Spouse name: Not on file  . Number of children: 3  . Years of education: Not on file  . Highest education level: Not on file  Occupational History  . Occupation: caregiver  Tobacco Use  . Smoking status: Never Smoker  . Smokeless tobacco: Never Used  Vaping Use  . Vaping Use: Never used  Substance and Sexual Activity  . Alcohol use: No    Comment: social/rare  . Drug use: No  . Sexual activity: Not on file  Other Topics Concern  . Not on file  Social History Narrative  . Not on file   Social Determinants of Health   Financial Resource Strain: Not on file  Food Insecurity: Not on file  Transportation Needs: Not on file  Physical Activity: Not on file  Stress: Not on file  Social Connections: Not on file     Family History: The patient's family history includes Asthma in her sister; Brain cancer in her sister; Breast cancer in her sister and sister; COPD in her sister; Colon polyps in her sister; Congestive Heart Failure in her sister; Emphysema in her sister; Heart Problems in her mother; Heart disease in her brother and sister; Pancreatic cancer in her sister. There is no history of Colon cancer, Esophageal cancer, Rectal cancer, or Stomach cancer.  ROS:   All other ROS reviewed and negative. Pertinent positives noted in the HPI.     EKGs/Labs/Other Studies Reviewed:   The following studies were personally reviewed by me today:  TTE 01/09/2020 1. Left ventricular ejection fraction, by estimation, is 60 to 65%. The  left ventricle has  normal function. The left ventricle has no regional  wall motion abnormalities. There is mild concentric left ventricular  hypertrophy. Left ventricular diastolic  parameters are consistent with Grade I diastolic dysfunction (impaired  relaxation). Elevated left atrial pressure.  2. Right ventricular systolic function is normal. The right ventricular  size is normal.  3. The mitral valve is normal in structure. No evidence of mitral valve  regurgitation. No evidence of mitral stenosis.  4. The aortic valve is normal in structure. Aortic valve regurgitation  is  not visualized. No aortic stenosis is present.  5. The inferior vena cava is normal in size with greater than 50%  respiratory variability, suggesting right atrial pressure of 3 mmHg.   Recent Labs: 12/11/2019: BNP 53.7; BUN 11; Creatinine, Ser 0.91; Potassium 4.3; Sodium 143   Recent Lipid Panel No results found for: CHOL, TRIG, HDL, CHOLHDL, VLDL, LDLCALC, LDLDIRECT  Physical Exam:   VS:  BP 118/62   Pulse 65   Ht 5\' 1"  (1.549 m)   Wt 177 lb (80.3 kg)   SpO2 95%   BMI 33.44 kg/m    Wt Readings from Last 3 Encounters:  04/29/20 177 lb (80.3 kg)  01/09/20 180 lb (81.6 kg)  12/11/19 183 lb 9.6 oz (83.3 kg)    General: Well nourished, well developed, in no acute distress Head: Atraumatic, normal size  Eyes: PEERLA, EOMI  Neck: Supple, no JVD Endocrine: No thryomegaly Cardiac: Normal S1, S2; RRR; no murmurs, rubs, or gallops Lungs: Clear to auscultation bilaterally, no wheezing, rhonchi or rales  Abd: Soft, nontender, no hepatomegaly  Ext: No edema, pulses 2+ Musculoskeletal: No deformities, BUE and BLE strength normal and equal Skin: Warm and dry, no rashes   Neuro: Alert and oriented to person, place, time, and situation, CNII-XII grossly intact, no focal deficits  Psych: Normal mood and affect   ASSESSMENT:   TRANIECE BOFFA is a 70 y.o. female who presents for the following: 1. Chest pain, unspecified type    2. SOB (shortness of breath) on exertion   3. Coronary artery disease involving native coronary artery of native heart without angina pectoris   4. Agatston coronary artery calcium score less than 100   5. Mixed hyperlipidemia    PLAN:   1. Chest pain, unspecified type 2. SOB (shortness of breath) on exertion -Nonobstructive CAD on recent coronary CTA.  BNP negative.  Echo normal.  No evidence of congestive heart failure on exam.  She has no extensive smoking history and her lungs are clear. Overall I suspect her symptoms are just related to deconditioning.  No cardiac etiology identified..  3. Coronary artery disease involving native coronary artery of native heart without angina pectoris 4. Agatston coronary artery calcium score less than 100 5. Mixed hyperlipidemia -Minimal, less than 25%, nonobstructive CAD in the LAD.  Coronary calcium score 53 (64th percentile). -Most recent LDL less than 70.  On aspirin 81 mg daily.  She will see [place] yearly.  Disposition: Return in about 1 year (around 04/29/2021).  Medication Adjustments/Labs and Tests Ordered: Current medicines are reviewed at length with the patient today.  Concerns regarding medicines are outlined above.  No orders of the defined types were placed in this encounter.  No orders of the defined types were placed in this encounter.   Patient Instructions  Medication Instructions:  The current medical regimen is effective;  continue present plan and medications.  *If you need a refill on your cardiac medications before your next appointment, please call your pharmacy*   Follow-Up: At [facility], you and your health needs are our priority.  As part of our continuing mission to provide you with exceptional heart care, we have created designated Provider Care Teams.  These Care Teams include your primary Cardiologist (physician) and Advanced Practice Providers (APPs -  Physician Assistants and Nurse Practitioners) who all work  together to provide you with the care you need, when you need it.  We recommend signing up for the patient portal called "MyChart".  Sign up information is provided on this After Visit Summary.  MyChart is used to connect with patients for Virtual Visits (Telemedicine).  Patients are able to view lab/test results, encounter notes, upcoming appointments, etc.  Non-urgent messages can be sent to your provider as well.   To learn more about what you can do with MyChart, go to [email or web address].    Your next appointment:   12 month(s)  The format for your next appointment:   In Person  Provider:   Eleonore Chiquito, MD        Time Spent with Patient: I have spent a total of 25 minutes with patient reviewing hospital notes, telemetry, EKGs, labs and examining the patient as well as establishing an assessment and plan that was discussed with the patient.  > 50% of time was spent in direct patient care.  Signed, Addison Naegeli. Audie Box, MD, [facility]  [address], [place], [place] [address] [phone]  04/29/2020 4:51 PM

## 2020-04-29 ENCOUNTER — Other Ambulatory Visit: Payer: Self-pay

## 2020-04-29 ENCOUNTER — Encounter: Payer: Self-pay | Admitting: Cardiovascular Disease

## 2020-04-29 ENCOUNTER — Ambulatory Visit: Payer: Medicare Other | Admitting: Cardiovascular Disease

## 2020-04-29 VITALS — BP 118/62 | HR 65 | Ht 61.0 in | Wt 177.0 lb

## 2020-04-29 DIAGNOSIS — R0602 Shortness of breath: Secondary | ICD-10-CM

## 2020-04-29 DIAGNOSIS — R079 Chest pain, unspecified: Secondary | ICD-10-CM | POA: Diagnosis not present

## 2020-04-29 DIAGNOSIS — I2510 Atherosclerotic heart disease of native coronary artery without angina pectoris: Secondary | ICD-10-CM | POA: Diagnosis not present

## 2020-04-29 DIAGNOSIS — E782 Mixed hyperlipidemia: Secondary | ICD-10-CM

## 2020-04-29 DIAGNOSIS — R931 Abnormal findings on diagnostic imaging of heart and coronary circulation: Secondary | ICD-10-CM

## 2020-04-29 NOTE — Patient Instructions (Signed)

## 2020-05-14 ENCOUNTER — Telehealth: Payer: Self-pay | Admitting: *Deleted

## 2020-05-14 DIAGNOSIS — Z006 Encounter for examination for normal comparison and control in clinical research program: Secondary | ICD-10-CM

## 2020-05-14 NOTE — Telephone Encounter (Signed)
I called patient for 90-day phone call for Identify Study.I left message for patient to call me back and sent e-mail also to patient.

## 2020-05-16 ENCOUNTER — Telehealth: Payer: Self-pay | Admitting: *Deleted

## 2020-05-16 DIAGNOSIS — Z006 Encounter for examination for normal comparison and control in clinical research program: Secondary | ICD-10-CM

## 2020-05-16 NOTE — Telephone Encounter (Signed)
I talked to patient for 90-day phone call for Identify Study. Patient is not having any cardiac symptoms.I reminded patient I would call her in November for 1 year follow-up.

## 2020-06-03 ENCOUNTER — Other Ambulatory Visit: Payer: Self-pay | Admitting: Gastroenterology

## 2020-08-16 NOTE — Progress Notes (Signed)
[place] Phone: [phone] Subjective:   Beth Sawyer, am serving as a scribe for Dr. Hulan Saas. This visit occurred during the SARS-CoV-2 public health emergency.  Safety protocols were in place, including screening questions prior to the visit, additional usage of staff PPE, and extensive cleaning of exam room while observing appropriate contact time as indicated for disinfecting solutions.   I'm seeing this patient by the request  of:  [name]  CC: Left ankle pain, back pain follow-up  [id number]  Beth Sawyer is a 70 y.o. female coming in with complaint of lumbar DDD. Last seen in Nov 2021.   L ankle swelling and pain. Feels like foot is going to give out on her. Has been wearing compression socks for 2 weeks. Elevates her leg when at home. Does note some leg swelling in calf. Pain over malleoli and over top of foot.  Patient states that it seems to come and go a little bit but it is kind of difficult to tell.  Patient has seen other providers including her cardiologist and was put on a water pill.  Has not noticed significant improvement.  Does feel though that the gabapentin has helped her back considerably and she has titrated all the way down to 100 mg only at night.  Xray 10/16/2019 Lumbar IMPRESSION: Mild degenerative disc disease most prominent at L3-L4. Mild L5-S1 facet hypertrophy.       Past Medical History:  Diagnosis Date   Allergy    seasonal   Anxiety    Arthritis    neck, back and legs    Benign essential HTN 03/30/2014   Borderline diabetes    diet controlled   Cataract    bilateral removed   GERD (gastroesophageal reflux disease)    Glaucoma    Hyperlipidemia    Hypertension    OSA (obstructive sleep apnea) 02/28/2016   PUD (peptic ulcer disease) 03/30/2014   Sleep apnea    wear cpap   Thyroid disease    Past Surgical History:  Procedure Laterality Date    BREAST BIOPSY Right 1986   BREAST EXCISIONAL BIOPSY Right    benign   CATARACT EXTRACTION Right 2014   CATARACT EXTRACTION Left 02/28/2014   CESAREAN SECTION  1971, 1975, 1978   x3   COLONOSCOPY     DILATION AND CURETTAGE OF UTERUS     X3   HEMORRHOID BANDING     HYSTEROSCOPY WITH D & C N/A 05/10/2012   Procedure: DILATATION AND CURETTAGE /HYSTEROSCOPY;  Surgeon: Gus Height, MD;  Location: [facility];  Service: Gynecology;  Laterality: N/A;   POLYPECTOMY     Social History   Socioeconomic History   Marital status: Widowed    Spouse name: Not on file   Number of children: 3   Years of education: Not on file   Highest education level: Not on file  Occupational History   Occupation: caregiver  Tobacco Use   Smoking status: Never   Smokeless tobacco: Never  Vaping Use   Vaping Use: Never used  Substance and Sexual Activity   Alcohol use: Sawyer    Comment: social/rare   Drug use: Sawyer   Sexual activity: Not on file  Other Topics Concern   Not on file  Social History Narrative   Not on file   Social Determinants of Health   Financial Resource Strain: Not on file  Food Insecurity: Not on  file  Transportation Needs: Not on file  Physical Activity: Not on file  Stress: Not on file  Social Connections: Not on file   Allergies  Allergen Reactions   Morphine And Related Nausea And Vomiting   Codeine Nausea And Vomiting    "made me real hot"   Oxycodone Nausea And Vomiting   Tramadol Nausea And Vomiting   Tape Rash   Family History  Problem Relation Age of Onset   Heart Problems Mother    Emphysema Sister    Asthma Sister    COPD Sister    Heart disease Brother    Heart disease Sister    Congestive Heart Failure Sister    Breast cancer Sister    Brain cancer Sister    Colon polyps Sister    Pancreatic cancer Sister    Breast cancer Sister    Colon cancer Neg Hx    Esophageal cancer Neg Hx    Rectal cancer Neg Hx    Stomach cancer Neg Hx     Current Outpatient  Medications (Endocrine & Metabolic):    levothyroxine (SYNTHROID) 75 MCG tablet, Take 75 mcg by mouth daily.  Current Outpatient Medications (Cardiovascular):    losartan (COZAAR) 25 MG tablet, Take 50 mg by mouth daily.    metoprolol succinate (TOPROL-XL) 25 MG 24 hr tablet, Take 1 tablet by mouth once daily   rosuvastatin (CRESTOR) 5 MG tablet, Take 5 mg by mouth every evening.    hydrochlorothiazide (HYDRODIURIL) 25 MG tablet, Take 1 tablet (25 mg total) by mouth daily.  Current Outpatient Medications (Respiratory):    cetirizine (ZYRTEC) 10 MG tablet, Take 10 mg by mouth daily. Pt takes generic form of Zyrtec  Current Outpatient Medications (Analgesics):    acetaminophen (TYLENOL) 650 MG CR tablet, Take by mouth.  Current Outpatient Medications (Hematological):    Cyanocobalamin (VITAMIN B12 PO), Take by mouth.  Current Outpatient Medications (Other):    Calcium Carbonate-Vitamin D (CALTRATE 600+D PO), Take 1 tablet by mouth 2 (two) times daily.    Cholecalciferol (VITAMIN D3) 1000 UNITS CAPS, Take 2,000 mg by mouth daily.   FLUoxetine (PROZAC) 10 MG capsule, Take 10 mg by mouth daily.   gabapentin (NEURONTIN) 100 MG capsule, Take 2 capsules (200 mg total) by mouth 2 (two) times daily.   hydrocortisone (ANUSOL-HC) 2.5 % rectal cream, Place 1 application rectally 2 (two) times daily. (Patient taking differently: Place 1 application rectally as needed.)   lansoprazole (PREVACID) 30 MG capsule, Take 1 capsule (30 mg total) by mouth daily.   Multiple Vitamin (MULITIVITAMIN WITH MINERALS) TABS, Take 1 tablet by mouth every evening.    nystatin cream (MYCOSTATIN), nystatin 100,000 unit/gram topical cream  APPLY CREAM TOPICALLY TO AFFECTED AREA TWICE DAILY   Omega-3 Fatty Acids (FISH OIL) 1000 MG CAPS, Take 1 capsule by mouth 3 (three) times daily.    pantoprazole (PROTONIX) 40 MG tablet, Take 1 tablet by mouth once daily   timolol (TIMOPTIC) 0.5 % ophthalmic solution, Place 1 drop into  both eyes daily.   Turmeric 500 MG CAPS, Take by mouth.   Reviewed prior external information including notes and imaging from  primary care provider As well as notes that were available from care everywhere and other healthcare systems.  Past medical history, social, surgical and family history all reviewed in electronic medical record.  Sawyer pertanent information unless stated regarding to the chief complaint.   Review of Systems:  Sawyer headache, visual changes, nausea, vomiting, diarrhea, constipation, dizziness, abdominal  pain, skin rash, fevers, chills, night sweats, weight loss, swollen lymph nodes, body aches, joint swelling, chest pain, shortness of breath, mood changes. POSITIVE muscle aches lower extremity swelling  Objective  Blood pressure 112/70, pulse 73, height 5\' 1"  (1.549 m), weight 178 lb (80.7 kg), SpO2 94 %.   General: Sawyer apparent distress alert and oriented x3 mood and affect normal, dressed appropriately.  HEENT: Pupils equal, extraocular movements intact  Respiratory: Patient's speak in full sentences and does not appear short of breath  Gait normal with good balance and coordination.  MSK: Mild loss of lordosis.  Some mild tenderness in the paraspinal musculature lumbar spine.  Left ankle does have a peroneal cyst noted.  Mild tightness noted of the ankle.  Patient does not have any pain over the lateral malleolus. Neurovascularly intact distally.  Patient is able to ambulate relatively well.  Patient does have 1+ pitting edema noted but seems to be bilateral of the lower extremities pretibial   Impression and Recommendations:     The above documentation has been reviewed and is accurate and complete Lyndal Pulley, DO

## 2020-08-19 ENCOUNTER — Ambulatory Visit: Payer: Medicare Other | Admitting: Family Medicine

## 2020-08-19 ENCOUNTER — Encounter: Payer: Self-pay | Admitting: Family Medicine

## 2020-08-19 ENCOUNTER — Ambulatory Visit (INDEPENDENT_AMBULATORY_CARE_PROVIDER_SITE_OTHER): Payer: Medicare Other

## 2020-08-19 ENCOUNTER — Other Ambulatory Visit: Payer: Self-pay

## 2020-08-19 VITALS — BP 112/70 | HR 73 | Ht 61.0 in | Wt 178.0 lb

## 2020-08-19 DIAGNOSIS — M25572 Pain in left ankle and joints of left foot: Secondary | ICD-10-CM | POA: Diagnosis not present

## 2020-08-19 DIAGNOSIS — M7989 Other specified soft tissue disorders: Secondary | ICD-10-CM

## 2020-08-19 DIAGNOSIS — M5136 Other intervertebral disc degeneration, lumbar region: Secondary | ICD-10-CM

## 2020-08-19 DIAGNOSIS — M79606 Pain in leg, unspecified: Secondary | ICD-10-CM | POA: Insufficient documentation

## 2020-08-19 DIAGNOSIS — M2550 Pain in unspecified joint: Secondary | ICD-10-CM | POA: Diagnosis not present

## 2020-08-19 LAB — COMPREHENSIVE METABOLIC PANEL
ALT: 27 U/L (ref 0–35)
AST: 28 U/L (ref 0–37)
Albumin: 4.3 g/dL (ref 3.5–5.2)
Alkaline Phosphatase: 58 U/L (ref 39–117)
BUN: 14 mg/dL (ref 6–23)
CO2: 28 mEq/L (ref 19–32)
Calcium: 9.5 mg/dL (ref 8.4–10.5)
Chloride: 97 mEq/L (ref 96–112)
Creatinine, Ser: 0.76 mg/dL (ref 0.40–1.20)
GFR: 79.12 mL/min (ref >60.00)
Glucose, Bld: 122 mg/dL — ABNORMAL HIGH (ref 70–99)
Potassium: 4 mEq/L (ref 3.5–5.1)
Sodium: 133 mEq/L — ABNORMAL LOW (ref 135–145)
Total Bilirubin: 0.4 mg/dL (ref 0.2–1.2)
Total Protein: 7.7 g/dL (ref 6.0–8.3)

## 2020-08-19 LAB — CBC WITH DIFFERENTIAL/PLATELET
Basophils Absolute: 0 10*3/uL (ref 0.0–0.1)
Basophils Relative: 0.3 % (ref 0.0–3.0)
Eosinophils Absolute: 0 10*3/uL (ref 0.0–0.7)
Eosinophils Relative: 0 % (ref 0.0–5.0)
HCT: 36.7 % (ref 36.0–46.0)
Hemoglobin: 12.3 g/dL (ref 12.0–15.0)
Lymphocytes Relative: 20.6 % (ref 12.0–46.0)
Lymphs Abs: 1.5 10*3/uL (ref 0.7–4.0)
MCHC: 33.6 g/dL (ref 30.0–36.0)
MCV: 89.5 fl (ref 78.0–100.0)
Monocytes Absolute: 0.5 10*3/uL (ref 0.1–1.0)
Monocytes Relative: 6.6 % (ref 3.0–12.0)
Neutro Abs: 5.3 10*3/uL (ref 1.4–7.7)
Neutrophils Relative %: 72.5 % (ref 43.0–77.0)
Platelets: 262 10*3/uL (ref 150.0–400.0)
RBC: 4.11 Mil/uL (ref 3.87–5.11)
RDW: 13.5 % (ref 11.5–15.5)
WBC: 7.3 10*3/uL (ref 4.0–10.5)

## 2020-08-19 LAB — BRAIN NATRIURETIC PEPTIDE: Pro B Natriuretic peptide (BNP): 228 pg/mL — ABNORMAL HIGH (ref 0.0–100.0)

## 2020-08-19 NOTE — Assessment & Plan Note (Signed)
History of degenerative disc disease of the lumbar spine but has been doing relatively well.  Continue the gabapentin.  Somewhat of concern now that the gabapentin could be causing some of the lower extremity swelling as well.  Patient will consider stopping it and see how that does with the swelling.  Patient will follow up with me again in 6 to 8 weeks otherwise.

## 2020-08-19 NOTE — Patient Instructions (Addendum)
Xray today Labs today Wear compression socks Ankle exercises If swelling gets worse, seek medical attention See me again in 6-8 weeks

## 2020-08-19 NOTE — Assessment & Plan Note (Signed)
Patient does have more pretibial pitting edema.  Could likely be contributing to some of the discomfort.  Has had difficulty with cardiology as well as pulmonary issues previously.  Has had a chronic cough recently.  We will get a BNP to see if this is potentially contributing.  Discussed with patient icing regimen and home exercises.  We will get x-ray of the lower extremity in the ankle as well but do not think that this is likely contributing to much.  Follow-up with me again in 6 to 8 weeks otherwise.  Patient knows if worsening symptoms to seek medical attention.  Patient did have family member at her side during the interview.

## 2020-09-03 NOTE — Progress Notes (Signed)
Cardiology Office Note:   Date:  09/04/2020  NAME:  [name]    MRN: [id number] DOB:  [birth year]   PCP:  Aura Dials, PA-C  Cardiologist:  None  Electrophysiologist:  None   Referring MD: Aura Dials, PA-C   Chief Complaint  Patient presents with   Leg Swelling   History of Present Illness:   Beth Sawyer is a 70 y.o. female with a hx of HTN, obesity, minimal CAD who presents for follow-up. Seen by PCP for LE edema. Normal BNP. Started on lasix. She reports the last few months she has had pain in her left leg.  This includes left knee and left ankle.  She reports it is swollen as well.  She reports it is exquisitely tender to touch.  She reports her leg catches her when she walks.  The edema is limited to the left leg.  She was started on Lasix by her primary care physician.  proBNP was normal there.  She had a proBNP obtained in June which was 228.  Given her age this is likely normal as well.  We used values of 300 for this.  Her BNP value was normal when she met me.  She has no evidence of volume overload.  She has trace edema in the left leg.  The left leg is exquisitely tender to palpation.  She had a DVT study performed on 07/12/2020 through the [facility] system.  There was no evidence of DVT.  She has not had a venous reflux study.  She reports no recent trauma or falls.  No infectious symptoms.  No fevers or chills.  Her lab work from late June shows normal kidney function.  No elevated white count.  She has no fevers or chills.  She describes no shortness of breath.  Symptoms strictly seem to be left leg pain as well as some mild edema.  She again has no evidence of congestive heart failure examination.  All of her testing in the past has been unremarkable for this.  She is quite limited with her ability to walk in the left leg.  This seems to be an orthopedic issue probably.  Problem List 1. HTN 2. Obesity -BMI 35 -A1c 6.6 -T chol 146, LDL 58, HDL 65, TG 131 -TSH  2.01 3. OSA 4. Non-obstructive CAD -coronary calcium 50 (64th percentile) -<25% LAD  Past Medical History: Past Medical History:  Diagnosis Date   Allergy    seasonal   Anxiety    Arthritis    neck, back and legs    Benign essential HTN 03/30/2014   Borderline diabetes    diet controlled   Cataract    bilateral removed   GERD (gastroesophageal reflux disease)    Glaucoma    Hyperlipidemia    Hypertension    OSA (obstructive sleep apnea) 02/28/2016   PUD (peptic ulcer disease) 03/30/2014   Sleep apnea    wear cpap   Thyroid disease     Past Surgical History: Past Surgical History:  Procedure Laterality Date   BREAST BIOPSY Right 1986   BREAST EXCISIONAL BIOPSY Right    benign   CATARACT EXTRACTION Right 2014   CATARACT EXTRACTION Left 02/28/2014   CESAREAN SECTION  1971, 1975, 1978   x3   COLONOSCOPY     DILATION AND CURETTAGE OF UTERUS     X3   HEMORRHOID BANDING     HYSTEROSCOPY WITH D & C N/A 05/10/2012   Procedure: DILATATION AND  CURETTAGE /HYSTEROSCOPY;  Surgeon: Gus Height, MD;  Location: [facility];  Service: Gynecology;  Laterality: N/A;   POLYPECTOMY      Current Medications: Current Meds  Medication Sig   albuterol (VENTOLIN HFA) 108 (90 Base) MCG/ACT inhaler SMARTSIG:2 Puff(s) By Mouth Every 4-6 Hours PRN   Calcium Carbonate-Vitamin D (CALTRATE 600+D PO) Take 1 tablet by mouth 2 (two) times daily.    cetirizine (ZYRTEC) 10 MG tablet Take 10 mg by mouth daily. Pt takes generic form of Zyrtec   Cholecalciferol (VITAMIN D3) 1000 UNITS CAPS Take 2,000 mg by mouth daily.   Cyanocobalamin (VITAMIN B12 PO) Take by mouth.   FLUoxetine (PROZAC) 10 MG capsule Take 10 mg by mouth daily.   furosemide (LASIX) 20 MG tablet Take 1 tablet (20 mg total) by mouth daily as needed.   gabapentin (NEURONTIN) 100 MG capsule Take 2 capsules (200 mg total) by mouth 2 (two) times daily.   hydrocortisone (ANUSOL-HC) 2.5 % rectal cream Place 1 application rectally 2 (two) times daily.  (Patient taking differently: Place 1 application rectally as needed.)   lansoprazole (PREVACID) 30 MG capsule Take 1 capsule (30 mg total) by mouth daily.   levothyroxine (SYNTHROID) 75 MCG tablet Take 75 mcg by mouth daily.   losartan (COZAAR) 25 MG tablet Take 50 mg by mouth daily.    magnesium 30 MG tablet Take 30 mg by mouth 2 (two) times daily.   metFORMIN (GLUCOPHAGE-XR) 500 MG 24 hr tablet Take 500 mg by mouth daily.   metoprolol succinate (TOPROL-XL) 25 MG 24 hr tablet Take 1 tablet by mouth once daily   Multiple Vitamin (MULITIVITAMIN WITH MINERALS) TABS Take 1 tablet by mouth every evening.    nystatin cream (MYCOSTATIN) nystatin 100,000 unit/gram topical cream  APPLY CREAM TOPICALLY TO AFFECTED AREA TWICE DAILY   Omega-3 Fatty Acids (FISH OIL) 1000 MG CAPS Take 1 capsule by mouth 3 (three) times daily.    pantoprazole (PROTONIX) 40 MG tablet Take 1 tablet by mouth once daily   potassium chloride (KLOR-CON) 10 MEQ tablet Take 10 mEq by mouth daily.   pramipexole (MIRAPEX) 0.125 MG tablet Take 0.25 mg by mouth at bedtime.   rosuvastatin (CRESTOR) 5 MG tablet Take 5 mg by mouth every evening.    timolol (TIMOPTIC) 0.5 % ophthalmic solution Place 1 drop into both eyes daily.   Turmeric 500 MG CAPS Take by mouth.     Allergies:    Morphine and related, Codeine, Oxycodone, Tramadol, and Tape   Social History: Social History   Socioeconomic History   Marital status: Widowed    Spouse name: Not on file   Number of children: 3   Years of education: Not on file   Highest education level: Not on file  Occupational History   Occupation: caregiver  Tobacco Use   Smoking status: Never   Smokeless tobacco: Never  Vaping Use   Vaping Use: Never used  Substance and Sexual Activity   Alcohol use: No    Comment: social/rare   Drug use: No   Sexual activity: Not on file  Other Topics Concern   Not on file  Social History Narrative   Not on file   Social Determinants of Health    Financial Resource Strain: Not on file  Food Insecurity: Not on file  Transportation Needs: Not on file  Physical Activity: Not on file  Stress: Not on file  Social Connections: Not on file     Family History: The patient's family  history includes Asthma in her sister; Brain cancer in her sister; Breast cancer in her sister and sister; COPD in her sister; Colon polyps in her sister; Congestive Heart Failure in her sister; Emphysema in her sister; Heart Problems in her mother; Heart disease in her brother and sister; Pancreatic cancer in her sister. There is no history of Colon cancer, Esophageal cancer, Rectal cancer, or Stomach cancer.  ROS:   All other ROS reviewed and negative. Pertinent positives noted in the HPI.     EKGs/Labs/Other Studies Reviewed:   The following studies were personally reviewed by me today:  EKG:  EKG is ordered today.  The ekg ordered today demonstrates normal sinus rhythm, heart rate 69, no acute ischemic changes or evidence of infarction, and was personally reviewed by me.   TTE 01/03/2020  1. Left ventricular ejection fraction, by estimation, is 60 to 65%. The  left ventricle has normal function. The left ventricle has no regional  wall motion abnormalities. There is mild concentric left ventricular  hypertrophy. Left ventricular diastolic  parameters are consistent with Grade I diastolic dysfunction (impaired  relaxation). Elevated left atrial pressure.   2. Right ventricular systolic function is normal. The right ventricular  size is normal.   3. The mitral valve is normal in structure. No evidence of mitral valve  regurgitation. No evidence of mitral stenosis.   4. The aortic valve is normal in structure. Aortic valve regurgitation is  not visualized. No aortic stenosis is present.   5. The inferior vena cava is normal in size with greater than 50%  respiratory variability, suggesting right atrial pressure of 3 mmHg.   Recent Labs: 12/11/2019:  BNP 53.7 08/19/2020: ALT 27; BUN 14; Creatinine, Ser 0.76; Hemoglobin 12.3; Platelets 262.0; Potassium 4.0; Pro B Natriuretic peptide (BNP) 228.0; Sodium 133   Recent Lipid Panel No results found for: CHOL, TRIG, HDL, CHOLHDL, VLDL, LDLCALC, LDLDIRECT  Physical Exam:   VS:  BP 116/80   Pulse 69   Resp 18   Ht 5' 1"  (1.549 m)   Wt 181 lb 9.6 oz (82.4 kg)   SpO2 92%   BMI 34.31 kg/m    Wt Readings from Last 3 Encounters:  09/04/20 181 lb 9.6 oz (82.4 kg)  08/19/20 178 lb (80.7 kg)  04/29/20 177 lb (80.3 kg)    General: Well nourished, well developed, in no acute distress Head: Atraumatic, normal size  Eyes: PEERLA, EOMI  Neck: Supple, no JVD Endocrine: No thryomegaly Cardiac: Normal S1, S2; RRR; no murmurs, rubs, or gallops Lungs: Clear to auscultation bilaterally, no wheezing, rhonchi or rales  Abd: Soft, nontender, no hepatomegaly  Ext: Trace edema in the left leg, tenderness palpation in the left knee and left calf muscle, no evidence of edema in the right leg Musculoskeletal: No deformities, BUE and BLE strength normal and equal Skin: Warm and dry, no rashes   Neuro: Alert and oriented to person, place, time, and situation, CNII-XII grossly intact, no focal deficits  Psych: Normal mood and affect   ASSESSMENT:   Beth Sawyer is a 69 y.o. female who presents for the following: 1. Localized edema   2. Agatston coronary artery calcium score less than 100   3. Primary hypertension     PLAN:   1. Localized edema -She has localized edema in the left leg.  The knee and calf muscle are tender to palpation.  Recent DVT study was negative for the [facility] system.  I did review this.  Her EKG  demonstrates normal sinus rhythm.  She has had several pro NT BNP values which are normal.  A value of 220 is normal.  Her value through her primary care is normal.  She had a BNP with me that was normal.  Her echocardiogram was normal.  She has no obstructive CAD.  She has no shortness of  breath.  She has no evidence of clinical heart failure.  She does not have congestive heart failure.  This appears to be unilateral edema and pain in the left leg.  I had high suspicion for DVT but she has ruled out for this.  This may be venous reflux versus an orthopedic issue.  We will check a venous reflux study today just to make sure this is not an issue.  I see no signs of infection.  She does not have gout.  I think she may need to see orthopedics if her venous reflux study is negative.  She can continue with the Lasix as needed however leg elevation is more beneficial here.  She reports compression stockings make it worse.  I suspect this could just be an orthopedic issue.  We will have her referred to orthopedics and follow-up with her primary care physician.  She will see me yearly.  2. Agatston coronary artery calcium score less than 100 -Continue statin therapy.  She sees [place] yearly for this.  3. Primary hypertension -Well-controlled.  Stop HCTZ as she is on Lasix.      Disposition: Return if symptoms worsen or fail to improve.  Medication Adjustments/Labs and Tests Ordered: Current medicines are reviewed at length with the patient today.  Concerns regarding medicines are outlined above.  Orders Placed This Encounter  Procedures   EKG 12-Lead   VAS [place] LOWER EXTREMITY VENOUS REFLUX   Meds ordered this encounter  Medications   furosemide (LASIX) 20 MG tablet    Sig: Take 1 tablet (20 mg total) by mouth daily as needed.    Dispense:  90 tablet    Refill:  3     Patient Instructions  Medication Instructions:  Stop HCTZ   *If you need a refill on your cardiac medications before your next appointment, please call your pharmacy*  Testing/Procedures: Venous Reflux Study today   Follow-Up: At [facility], you and your health needs are our priority.  As part of our continuing mission to provide you with exceptional heart care, we have created designated Provider Care  Teams.  These Care Teams include your primary Cardiologist (physician) and Advanced Practice Providers (APPs -  Physician Assistants and Nurse Practitioners) who all work together to provide you with the care you need, when you need it.  We recommend signing up for the patient portal called "MyChart".  Sign up information is provided on this After Visit Summary.  MyChart is used to connect with patients for Virtual Visits (Telemedicine).  Patients are able to view lab/test results, encounter notes, upcoming appointments, etc.  Non-urgent messages can be sent to your provider as well.   To learn more about what you can do with MyChart, go to [email or web address].    Your next appointment:   12 month(s)  The format for your next appointment:   In Person  Provider:   Eleonore Chiquito, MD   Other Instructions Please call [phone] If they need any information from [place] please let us know.    Time Spent with Patient: I have spent a total of 35 minutes with patient reviewing hospital  notes, telemetry, EKGs, labs and examining the patient as well as establishing an assessment and plan that was discussed with the patient.  > 50% of time was spent in direct patient care.  Signed, Addison Naegeli. Audie Box, MD, [place]  [address] [place], [place] [address] [phone]  09/04/2020 10:34 AM

## 2020-09-04 ENCOUNTER — Ambulatory Visit: Payer: Medicare Other | Admitting: Cardiovascular Disease

## 2020-09-04 ENCOUNTER — Ambulatory Visit (HOSPITAL_COMMUNITY)
Admission: RE | Admit: 2020-09-04 | Discharge: 2020-09-04 | Disposition: A | Discharge status: Home / Self Care | Payer: Medicare Other | Source: Ambulatory Visit | Attending: Cardiovascular Disease | Admitting: Cardiovascular Disease

## 2020-09-04 ENCOUNTER — Other Ambulatory Visit: Payer: Self-pay

## 2020-09-04 ENCOUNTER — Encounter: Payer: Self-pay | Admitting: Cardiovascular Disease

## 2020-09-04 ENCOUNTER — Encounter (HOSPITAL_COMMUNITY): Payer: Medicare Other

## 2020-09-04 VITALS — BP 116/80 | HR 69 | Resp 18 | Ht 61.0 in | Wt 181.6 lb

## 2020-09-04 DIAGNOSIS — I10 Essential (primary) hypertension: Secondary | ICD-10-CM | POA: Diagnosis not present

## 2020-09-04 DIAGNOSIS — R600 Localized edema: Secondary | ICD-10-CM | POA: Diagnosis not present

## 2020-09-04 DIAGNOSIS — R931 Abnormal findings on diagnostic imaging of heart and coronary circulation: Secondary | ICD-10-CM

## 2020-09-04 MED ORDER — FUROSEMIDE 20 MG PO TABS: 20.0000 mg | ORAL_TABLET | Freq: Every day | ORAL | 3 refills | Status: AC | PRN

## 2020-09-04 NOTE — Patient Instructions (Signed)
Medication Instructions:  Stop HCTZ   *If you need a refill on your cardiac medications before your next appointment, please call your pharmacy*  Testing/Procedures: Venous Reflux Study today   Follow-Up: At [facility], you and your health needs are our priority.  As part of our continuing mission to provide you with exceptional heart care, we have created designated Provider Care Teams.  These Care Teams include your primary Cardiologist (physician) and Advanced Practice Providers (APPs -  Physician Assistants and Nurse Practitioners) who all work together to provide you with the care you need, when you need it.  We recommend signing up for the patient portal called "MyChart".  Sign up information is provided on this After Visit Summary.  MyChart is used to connect with patients for Virtual Visits (Telemedicine).  Patients are able to view lab/test results, encounter notes, upcoming appointments, etc.  Non-urgent messages can be sent to your provider as well.   To learn more about what you can do with MyChart, go to [email or web address].    Your next appointment:   12 month(s)  The format for your next appointment:   In Person  Provider:   Eleonore Chiquito, MD   Other Instructions Please call [phone] If they need any information from [place] please let us know.

## 2020-09-09 ENCOUNTER — Other Ambulatory Visit: Payer: Self-pay | Admitting: Gastroenterology

## 2020-10-02 NOTE — Progress Notes (Signed)
[facility] [name] Phone: [phone] Subjective:   Beth Sawyer, am serving as a scribe for Dr. Hulan Saas.  I'm seeing this patient by the request  of:  [name]  CC: Left ankle pain follow-up  [id number]  08/19/2020 Patient does have more pretibial pitting edema.  Could likely be contributing to some of the discomfort.  Has had difficulty with cardiology as well as pulmonary issues previously.  Has had a chronic cough recently.  We will get a BNP to see if this is potentially contributing.  Discussed with patient icing regimen and home exercises.  We will get x-ray of the lower extremity in the ankle as well but do not think that this is likely contributing to much.  Follow-up with me again in 6 to 8 weeks otherwise.  Patient knows if worsening symptoms to seek medical attention.  Patient did have family member at her side during the interview.  History of degenerative disc disease of the lumbar spine but has been doing relatively well.  Continue the gabapentin.  Somewhat of concern now that the gabapentin could be causing some of the lower extremity swelling as well.  Patient will consider stopping it and see how that does with the swelling.  Patient will follow up with me again in 6 to 8 weeks otherwise.  Update 10/03/2020 Beth Sawyer is a 70 y.o. female coming in with complaint of L ankle and LBP.  Patient was found to have some edema of the lower extremity.  Chest x-ray did not show any cardiopulmonary disease occurring at this time.  BN P was minorly elevated and was to follow-up with primary care provider as well as cardiologist.  Patient has been continued on Lasix.  Continues to have swelling in the left lower extremity when she saw cardiology last month.  Reviewed patient's studies been ruled out DVT.  Was sent for a venous reflux study which also appeared to be unremarkable.  Patient then saw  pulmonology who also did not make any significant large changes.  Patient saw a orthopedic physician and was given a left knee injection September 12, 2020.  Patient states that having bilateral leg cramping with numbness and pain in L shoulder and back that radiates down left arm. 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   Ankle xray 08/19/2020 IMPRESSION: No acute osseous abnormality identified.   Chest xray 08/19/2020 IMPRESSION: No active cardiopulmonary disease.  Lumbar x-rays taken in August 2021 were independently visualized by me showing mild degenerative disc disease from L3-S1.  Past Medical History:  Diagnosis Date   Allergy    seasonal   Anxiety    Arthritis    neck, back and legs    Benign essential HTN 03/30/2014   Borderline diabetes    diet controlled   Cataract    bilateral removed   GERD (gastroesophageal reflux disease)    Glaucoma    Hyperlipidemia    Hypertension    OSA (obstructive sleep apnea) 02/28/2016   PUD (peptic ulcer disease) 03/30/2014   Sleep apnea    wear cpap   Thyroid disease    Past Surgical History:  Procedure Laterality Date   BREAST BIOPSY Right 1986   BREAST EXCISIONAL BIOPSY Right    benign   CATARACT EXTRACTION Right 2014   CATARACT EXTRACTION Left 02/28/2014   CESAREAN SECTION  1971,  [place]   x3   COLONOSCOPY     DILATION AND CURETTAGE OF UTERUS     X3   HEMORRHOID BANDING     HYSTEROSCOPY WITH D & C N/A 05/10/2012   Procedure: DILATATION AND CURETTAGE /HYSTEROSCOPY;  Surgeon: Gus Height, MD;  Location: [facility];  Service: Gynecology;  Laterality: N/A;   POLYPECTOMY     Social History   Socioeconomic History   Marital status: Widowed    Spouse name: Not on file   Number of children: 3   Years of education: Not on file   Highest education level: Not on file  Occupational History   Occupation: caregiver  Tobacco Use   Smoking status: Never   Smokeless tobacco: Never  Vaping Use    Vaping Use: Never used  Substance and Sexual Activity   Alcohol use: No    Comment: social/rare   Drug use: No   Sexual activity: Not on file  Other Topics Concern   Not on file  Social History Narrative   Not on file   Social Determinants of Health   Financial Resource Strain: Not on file  Food Insecurity: Not on file  Transportation Needs: Not on file  Physical Activity: Not on file  Stress: Not on file  Social Connections: Not on file   Allergies  Allergen Reactions   Morphine And Related Nausea And Vomiting   Codeine Nausea And Vomiting    "made me real hot"   Oxycodone Nausea And Vomiting   Tramadol Nausea And Vomiting   Tape Rash   Family History  Problem Relation Age of Onset   Heart Problems Mother    Emphysema Sister    Asthma Sister    COPD Sister    Heart disease Brother    Heart disease Sister    Congestive Heart Failure Sister    Breast cancer Sister    Brain cancer Sister    Colon polyps Sister    Pancreatic cancer Sister    Breast cancer Sister    Colon cancer Neg Hx    Esophageal cancer Neg Hx    Rectal cancer Neg Hx    Stomach cancer Neg Hx     Current Outpatient Medications (Endocrine & Metabolic):    levothyroxine (SYNTHROID) 75 MCG tablet, Take 75 mcg by mouth daily.   metFORMIN (GLUCOPHAGE-XR) 500 MG 24 hr tablet, Take 500 mg by mouth daily.  Current Outpatient Medications (Cardiovascular):    furosemide (LASIX) 20 MG tablet, Take 1 tablet (20 mg total) by mouth daily as needed.   losartan (COZAAR) 25 MG tablet, Take 50 mg by mouth daily.    metoprolol succinate (TOPROL-XL) 25 MG 24 hr tablet, Take 1 tablet by mouth once daily   rosuvastatin (CRESTOR) 5 MG tablet, Take 5 mg by mouth every evening.   Current Outpatient Medications (Respiratory):    albuterol (VENTOLIN HFA) 108 (90 Base) MCG/ACT inhaler, SMARTSIG:2 Puff(s) By Mouth Every 4-6 Hours PRN   cetirizine (ZYRTEC) 10 MG tablet, Take 10 mg by mouth daily. Pt takes generic form  of Zyrtec   Current Outpatient Medications (Hematological):    Cyanocobalamin (VITAMIN B12 PO), Take by mouth.  Current Outpatient Medications (Other):    Calcium Carbonate-Vitamin D (CALTRATE 600+D PO), Take 1 tablet by mouth 2 (two) times daily.    Cholecalciferol (VITAMIN D3) 1000 UNITS CAPS, Take 2,000 mg by mouth daily.   FLUoxetine (PROZAC) 10 MG capsule, Take 10 mg by mouth daily.   gabapentin (NEURONTIN) 100  MG capsule, Take 2 capsules (200 mg total) by mouth 2 (two) times daily.   hydrocortisone (ANUSOL-HC) 2.5 % rectal cream, Place 1 application rectally 2 (two) times daily. (Patient taking differently: Place 1 application rectally as needed.)   lansoprazole (PREVACID) 30 MG capsule, Take 1 capsule (30 mg total) by mouth daily.   magnesium 30 MG tablet, Take 30 mg by mouth 2 (two) times daily.   Multiple Vitamin (MULITIVITAMIN WITH MINERALS) TABS, Take 1 tablet by mouth every evening.    nystatin cream (MYCOSTATIN), nystatin 100,000 unit/gram topical cream  APPLY CREAM TOPICALLY TO AFFECTED AREA TWICE DAILY   Omega-3 Fatty Acids (FISH OIL) 1000 MG CAPS, Take 1 capsule by mouth 3 (three) times daily.    pantoprazole (PROTONIX) 40 MG tablet, Take 1 tablet by mouth once daily   potassium chloride (KLOR-CON) 10 MEQ tablet, Take 10 mEq by mouth daily.   pramipexole (MIRAPEX) 0.125 MG tablet, Take 0.25 mg by mouth at bedtime.   timolol (TIMOPTIC) 0.5 % ophthalmic solution, Place 1 drop into both eyes daily.   Turmeric 500 MG CAPS, Take by mouth.   Reviewed prior external information including notes and imaging from  primary care provider As well as notes that were available from care everywhere and other healthcare systems.  Past medical history, social, surgical and family history all reviewed in electronic medical record.  No pertanent information unless stated regarding to the chief complaint.   Review of Systems:  No headache, visual changes, nausea, vomiting, diarrhea,  constipation, dizziness, abdominal pain, skin rash, fevers, chills, night sweats, weight loss, swollen lymph nodes, body aches, joint swelling, chest pain, shortness of breath, mood changes. POSITIVE muscle aches  Objective  Blood pressure 122/82, pulse 61, height '5\' 1"'$  (1.549 m), weight 179 lb (81.2 kg), SpO2 97 %.   General: No apparent distress alert and oriented x3 mood and affect normal, dressed appropriately.  Overweight patient does have a resting tremor noted. HEENT: Pupils equal, extraocular movements intact  Respiratory: Patient's speak in full sentences and does not appear short of breath  Cardiovascular: No lower extremity edema, non tender, no erythema  Gait normal with good balance and coordination.  MSK: Mild arthritic changes noted.  Patient's left shoulder does have some mild limited range of motion in forward flexion as well as external rotation.  Mild positive impingement noted.  Rotator cuff seems to be intact. Low back exam does have significant loss of lordosis.  Patient does have positive straight leg test noted.  This seems to be on the left side more than the right.  4-5 strength in lower extremities bilaterally.  Difficult to assess secondary to the patient's deep tendon reflexes secondary to patient being in discomfort.    Impression and Recommendations:     The above documentation has been reviewed and is accurate and complete Lyndal Pulley, DO

## 2020-10-03 ENCOUNTER — Ambulatory Visit: Payer: Self-pay

## 2020-10-03 ENCOUNTER — Ambulatory Visit: Payer: Medicare Other | Admitting: Family Medicine

## 2020-10-03 ENCOUNTER — Ambulatory Visit (INDEPENDENT_AMBULATORY_CARE_PROVIDER_SITE_OTHER): Payer: Medicare Other

## 2020-10-03 ENCOUNTER — Encounter: Payer: Self-pay | Admitting: Family Medicine

## 2020-10-03 ENCOUNTER — Other Ambulatory Visit: Payer: Self-pay

## 2020-10-03 VITALS — BP 122/82 | HR 61 | Ht 61.0 in | Wt 179.0 lb

## 2020-10-03 DIAGNOSIS — M5136 Other intervertebral disc degeneration, lumbar region: Secondary | ICD-10-CM

## 2020-10-03 DIAGNOSIS — M25512 Pain in left shoulder: Secondary | ICD-10-CM

## 2020-10-03 NOTE — Patient Instructions (Addendum)
Good to see you Xray of left shoulder  MRI  lumbar ordered [phone] Ice before bed use voltaren gel  See me again in 4-6 weeks

## 2020-10-03 NOTE — Assessment & Plan Note (Signed)
Acute on chronic left shoulder pain.  Discussed with patient about icing regimen and home exercises.  Patient has had a history of frozen shoulder previously and we may need to consider this and seemed relieved presumptively.  We will get x-rays on the way out.  We will consider the possibility of ultrasound as well as potential injection.  Follow-up with me again in 6 weeks for this problem.

## 2020-10-03 NOTE — Assessment & Plan Note (Signed)
Significant degenerative disc disease but having no radicular symptoms.  Positive straight leg test with mild weakness noted.  Continuing to have cramping in the legs as well that is interfering with regular daily activities as well as patient's sleep.  I do feel at this point that patient could be a candidate for possible MRI and further evaluation and would be a candidate for possible epidural.  Patient would of course want to avoid any surgical intervention and will monitor closely.  Depending on imaging we will discuss treatment options otherwise.

## 2020-10-10 ENCOUNTER — Other Ambulatory Visit: Payer: Self-pay | Admitting: Physician Assistant

## 2020-10-10 ENCOUNTER — Ambulatory Visit
Admission: RE | Admit: 2020-10-10 | Discharge: 2020-10-10 | Disposition: A | Discharge status: Home / Self Care | Payer: Medicare Other | Source: Ambulatory Visit | Attending: Family Medicine | Admitting: Family Medicine

## 2020-10-10 ENCOUNTER — Other Ambulatory Visit: Payer: Self-pay

## 2020-10-10 DIAGNOSIS — Z1231 Encounter for screening mammogram for malignant neoplasm of breast: Secondary | ICD-10-CM

## 2020-10-10 DIAGNOSIS — M5136 Other intervertebral disc degeneration, lumbar region: Secondary | ICD-10-CM

## 2020-10-11 ENCOUNTER — Telehealth: Payer: Self-pay | Admitting: Family Medicine

## 2020-10-11 NOTE — Telephone Encounter (Signed)
Per pt, she has reviewed MRI results and her PCP also reviewed. She called as she thinks she needs to be seen by [place] ASAP. I did not schedule anything as I advised her to wait until Dr. Tamala Julian reviews MRI and advised plan from there.

## 2020-10-25 ENCOUNTER — Ambulatory Visit: Payer: Medicare Other | Admitting: Adult Health

## 2020-10-31 ENCOUNTER — Ambulatory Visit: Payer: Medicare Other | Admitting: Family Medicine

## 2020-11-21 ENCOUNTER — Other Ambulatory Visit: Payer: Self-pay | Admitting: Gastroenterology

## 2020-11-29 ENCOUNTER — Ambulatory Visit
Admission: RE | Admit: 2020-11-29 | Discharge: 2020-11-29 | Disposition: A | Discharge status: Home / Self Care | Payer: Medicare Other | Source: Ambulatory Visit | Attending: Physician Assistant | Admitting: Physician Assistant

## 2020-11-29 ENCOUNTER — Other Ambulatory Visit: Payer: Self-pay

## 2020-11-29 DIAGNOSIS — Z1231 Encounter for screening mammogram for malignant neoplasm of breast: Secondary | ICD-10-CM

## 2020-12-13 ENCOUNTER — Ambulatory Visit: Payer: Medicare Other | Admitting: Cardiovascular Disease

## 2021-01-30 ENCOUNTER — Encounter (HOSPITAL_BASED_OUTPATIENT_CLINIC_OR_DEPARTMENT_OTHER): Payer: Self-pay | Admitting: Emergency Medicine

## 2021-01-30 ENCOUNTER — Other Ambulatory Visit: Payer: Self-pay

## 2021-01-30 ENCOUNTER — Emergency Department (HOSPITAL_BASED_OUTPATIENT_CLINIC_OR_DEPARTMENT_OTHER)
Admission: EM | Admit: 2021-01-30 | Discharge: 2021-01-30 | Disposition: A | Discharge status: Home / Self Care | Payer: Medicare Other | Attending: Emergency Medicine | Admitting: Emergency Medicine

## 2021-01-30 ENCOUNTER — Emergency Department (HOSPITAL_BASED_OUTPATIENT_CLINIC_OR_DEPARTMENT_OTHER): Payer: Medicare Other | Admitting: Radiology

## 2021-01-30 DIAGNOSIS — Z79899 Other long term (current) drug therapy: Secondary | ICD-10-CM | POA: Insufficient documentation

## 2021-01-30 DIAGNOSIS — E119 Type 2 diabetes mellitus without complications: Secondary | ICD-10-CM | POA: Diagnosis not present

## 2021-01-30 DIAGNOSIS — Z7984 Long term (current) use of oral hypoglycemic drugs: Secondary | ICD-10-CM | POA: Diagnosis not present

## 2021-01-30 DIAGNOSIS — W540XXA Bitten by dog, initial encounter: Secondary | ICD-10-CM | POA: Diagnosis not present

## 2021-01-30 DIAGNOSIS — Z203 Contact with and (suspected) exposure to rabies: Secondary | ICD-10-CM | POA: Diagnosis not present

## 2021-01-30 DIAGNOSIS — Z2914 Encounter for prophylactic rabies immune globin: Secondary | ICD-10-CM | POA: Diagnosis not present

## 2021-01-30 DIAGNOSIS — S61452A Open bite of left hand, initial encounter: Secondary | ICD-10-CM | POA: Diagnosis present

## 2021-01-30 DIAGNOSIS — Z23 Encounter for immunization: Secondary | ICD-10-CM | POA: Diagnosis not present

## 2021-01-30 DIAGNOSIS — I10 Essential (primary) hypertension: Secondary | ICD-10-CM | POA: Insufficient documentation

## 2021-01-30 DIAGNOSIS — R202 Paresthesia of skin: Secondary | ICD-10-CM | POA: Insufficient documentation

## 2021-01-30 MED ORDER — RABIES VACCINE, PCEC IM SUSR
1.0000 mL | Freq: Once | INTRAMUSCULAR | Status: AC
  Administered 2021-01-30: 1 mL via INTRAMUSCULAR
  Filled 2021-01-30: qty 1

## 2021-01-30 MED ORDER — AMOXICILLIN-POT CLAVULANATE 875-125 MG PO TABS
1.0000 | ORAL_TABLET | Freq: Once | ORAL | Status: AC
  Administered 2021-01-30: 1 via ORAL
  Filled 2021-01-30: qty 1

## 2021-01-30 MED ORDER — RABIES IMMUNE GLOBULIN 150 UNIT/ML IM INJ
20.0000 [IU]/kg | INJECTION | Freq: Once | INTRAMUSCULAR | Status: AC
  Administered 2021-01-30: 1650 [IU] via INTRAMUSCULAR
  Filled 2021-01-30: qty 12

## 2021-01-30 MED ORDER — LIDOCAINE-EPINEPHRINE-TETRACAINE (LET) TOPICAL GEL
3.0000 mL | Freq: Once | TOPICAL | Status: AC
  Administered 2021-01-30: 3 mL via TOPICAL
  Filled 2021-01-30: qty 3

## 2021-01-30 MED ORDER — AMOXICILLIN-POT CLAVULANATE 875-125 MG PO TABS: 1.0000 | ORAL_TABLET | Freq: Two times a day (BID) | ORAL | 0 refills | Status: AC

## 2021-01-30 NOTE — ED Triage Notes (Signed)
Pt arrives to ED with c/o dog bite to left hand. This happened this afternoon. Pt reports that she did not know the dog and is unsure if it was vaccinated. Pt is right handed. Unsure of last Tdap.

## 2021-01-30 NOTE — Discharge Instructions (Addendum)
You were seen in the emergency department after being bitten by a stray dog.  Your x-rays did not show any fractures of the hand.  While you were here, we gave you an antibiotic called Augmentin.  You also got a rabies shot while you were here as well as some rabies antibodies.  You will need to get more rabies shots.  You will need for rabies shots total.  Today, you got 1, and this is considered day 0.  You will also need to come to the ED or see your PCP to get the rest of your shots.  These are the dates for the rest your shots:  December 11 December 15 December 22   If any symptoms change or worsen acutely, please return to the nearest emergency department.

## 2021-01-30 NOTE — ED Provider Notes (Signed)
[facility] EM[facility]ENC[facility] DEPT Provider Note   CSN: [id number] Arrival date & time: 01/30/21  1637     History Chief Complaint  Patient presents with   Animal Bite    Beth Sawyer is a 70 y.o. female who is here today after she was bitten by a stray dog on her left hand.  She states that this happened around 3 or 4 PM today.  The stray dog ran away and is unsure where it went.  However, local animal control is aware and is attempting to find the dog.  The patient endorses worsening pain in the left hand from the bite.  She also has intermittent numbness/tingling in the left arm after the bite.  The swelling is also enlarging, and she noticed more bruising in the area.  She denies fevers or chills.  Her last tetanus shot was in 2019.  Otherwise unsure about vaccination status.  No other complaints or concerns today.   Animal Bite Associated symptoms: numbness   Associated symptoms: no fever       Past Medical History:  Diagnosis Date   Allergy    seasonal   Anxiety    Arthritis    neck, back and legs    Benign essential HTN 03/30/2014   Borderline diabetes    diet controlled   Cataract    bilateral removed   GERD (gastroesophageal reflux disease)    Glaucoma    Hyperlipidemia    Hypertension    OSA (obstructive sleep apnea) 02/28/2016   PUD (peptic ulcer disease) 03/30/2014   Sleep apnea    wear cpap   Thyroid disease     Patient Active Problem List   Diagnosis Date Noted   Left shoulder pain 10/03/2020   Pain and swelling of lower extremity 08/19/2020   Degenerative disc disease, lumbar 10/17/2019   Degenerative disc disease, cervical 10/17/2019   OSA (obstructive sleep apnea) 02/28/2016   Internal hemorrhoids 04/06/2014   PUD (peptic ulcer disease) 03/30/2014   Benign essential HTN 03/30/2014   Non-cardiac chest pain 01/09/2014   Esophageal reflux 01/09/2014    Past Surgical History:  Procedure Laterality Date   BREAST BIOPSY Right 1986   BREAST  EXCISIONAL BIOPSY Right    benign   CATARACT EXTRACTION Right 2014   CATARACT EXTRACTION Left 02/28/2014   CESAREAN SECTION  1971, 1975, 1978   x3   COLONOSCOPY     DILATION AND CURETTAGE OF UTERUS     X3   HEMORRHOID BANDING     HYSTEROSCOPY WITH D & C N/A 05/10/2012   Procedure: DILATATION AND CURETTAGE /HYSTEROSCOPY;  Surgeon: Gus Height, MD;  Location: [facility];  Service: Gynecology;  Laterality: N/A;   POLYPECTOMY       OB History   No obstetric history on file.     Family History  Problem Relation Age of Onset   Heart Problems Mother    Emphysema Sister    Asthma Sister    COPD Sister    Heart disease Brother    Heart disease Sister    Congestive Heart Failure Sister    Breast cancer Sister    Brain cancer Sister    Colon polyps Sister    Pancreatic cancer Sister    Breast cancer Sister    Colon cancer Neg Hx    Esophageal cancer Neg Hx    Rectal cancer Neg Hx    Stomach cancer Neg Hx     Social History   Tobacco Use  Smoking status: Never   Smokeless tobacco: Never  Vaping Use   Vaping Use: Never used  Substance Use Topics   Alcohol use: No    Comment: social/rare   Drug use: No    Home Medications Prior to Admission medications   Medication Sig Start Date End Date Taking? Authorizing Provider  amoxicillin-clavulanate (AUGMENTIN) 875-125 MG tablet Take 1 tablet by mouth 2 (two) times daily for 14 days. 01/30/21 02/13/21 Yes Orvis Brill, MD  albuterol (VENTOLIN HFA) 108 (90 Base) MCG/ACT inhaler SMARTSIG:2 Puff(s) By Mouth Every 4-6 Hours PRN 06/27/20   [provider]  Calcium Carbonate-Vitamin D (CALTRATE 600+D PO) Take 1 tablet by mouth 2 (two) times daily.     [provider]  cetirizine (ZYRTEC) 10 MG tablet Take 10 mg by mouth daily. Pt takes generic form of Zyrtec    [provider]  Cholecalciferol (VITAMIN D3) 1000 UNITS CAPS Take 2,000 mg by mouth daily.    [provider]  Cyanocobalamin (VITAMIN B12  PO) Take by mouth.    [provider]  FLUoxetine (PROZAC) 10 MG capsule Take 10 mg by mouth daily.    [provider]  furosemide (LASIX) 20 MG tablet Take 1 tablet (20 mg total) by mouth daily as needed. 09/04/20 12/03/20  O'NealCassie Freer, MD  gabapentin (NEURONTIN) 100 MG capsule Take 2 capsules (200 mg total) by mouth 2 (two) times daily. 01/09/20   Lyndal Pulley, DO  hydrocortisone (ANUSOL-HC) 2.5 % rectal cream Place 1 application rectally 2 (two) times daily. Patient taking differently: Place 1 application rectally as needed. 11/04/18   Mauri Pole, MD  lansoprazole (PREVACID) 30 MG capsule Take 1 capsule (30 mg total) by mouth daily. 03/19/15   Mauri Pole, MD  levothyroxine (SYNTHROID) 75 MCG tablet Take 75 mcg by mouth daily.    [provider]  losartan (COZAAR) 25 MG tablet Take 50 mg by mouth daily.  10/23/19   [provider]  magnesium 30 MG tablet Take 30 mg by mouth 2 (two) times daily.    [provider]  metFORMIN (GLUCOPHAGE-XR) 500 MG 24 hr tablet Take 500 mg by mouth daily. 07/12/20   [provider]  metoprolol succinate (TOPROL-XL) 25 MG 24 hr tablet Take 1 tablet by mouth once daily 10/10/18   [provider]  Multiple Vitamin (MULITIVITAMIN WITH MINERALS) TABS Take 1 tablet by mouth every evening.     [provider]  nystatin cream (MYCOSTATIN) nystatin 100,000 unit/gram topical cream  APPLY CREAM TOPICALLY TO AFFECTED AREA TWICE DAILY    [provider]  Omega-3 Fatty Acids (FISH OIL) 1000 MG CAPS Take 1 capsule by mouth 3 (three) times daily.     [provider]  pantoprazole (PROTONIX) 40 MG tablet Take 1 tablet by mouth once daily 11/22/20   Nandigam, Venia Minks, MD  potassium chloride (KLOR-CON) 10 MEQ tablet Take 10 mEq by mouth daily. 08/30/20   [provider]  pramipexole (MIRAPEX) 0.125 MG tablet Take 0.25 mg by mouth at bedtime. 07/29/20   [provider]  rosuvastatin (CRESTOR) 5 MG tablet Take 5 mg by mouth every evening.     [provider]  timolol (TIMOPTIC) 0.5 % ophthalmic solution Place 1 drop into both eyes daily.    [provider]  Turmeric 500 MG CAPS Take by mouth.    [provider]    Allergies    Morphine and related, Codeine, Oxycodone, Tramadol,  and Tape  Review of Systems   Review of Systems  Constitutional:  Negative for fever.  HENT: Negative.    Eyes: Negative.   Respiratory: Negative.    Cardiovascular: Negative.   Gastrointestinal: Negative.   Endocrine: Negative.   Genitourinary: Negative.   Musculoskeletal: Negative.   Skin:  Positive for wound.  Allergic/Immunologic: Negative.   Neurological:  Positive for numbness.  Hematological: Negative.   Psychiatric/Behavioral: Negative.     Physical Exam Updated Vital Signs BP (!) 158/99   Pulse 69   Temp 98.5 F (36.9 C) (Oral)   Resp 16   Ht 5\' 1"  (1.549 m)   Wt 83.5 kg   SpO2 100%   BMI 34.77 kg/m   Physical Exam Constitutional:      General: She is not in acute distress.    Appearance: Normal appearance.  HENT:     Head: Normocephalic and atraumatic.  Eyes:     Extraocular Movements: Extraocular movements intact.     Pupils: Pupils are equal, round, and reactive to light.  Cardiovascular:     Rate and Rhythm: Normal rate and regular rhythm.     Heart sounds: No murmur heard.   No friction rub. No gallop.  Pulmonary:     Effort: Pulmonary effort is normal.     Breath sounds: No wheezing, rhonchi or rales.  Abdominal:     General: Abdomen is flat. There is no distension.  Musculoskeletal:     Comments: There is a 4 x 4 cm area of edema and ecchymosis over the dorsum of the left hand.  There is a visible puncture mark without signs of bleeding or drainage.  The hand is exquisitely tender to palpation.  Sensation is intact in the left arm and left hand.   Neurological:     Mental Status: She is  alert.    ED Results / Procedures / Treatments   Labs (all labs ordered are listed, but only abnormal results are displayed) Labs Reviewed - No data to display  EKG None  Radiology DG Hand Complete Left  Result Date: 01/30/2021 CLINICAL DATA:  Dog bite. EXAM: LEFT HAND - COMPLETE 3+ VIEW COMPARISON:  None. FINDINGS: There is soft tissue swelling over the dorsum of the hand. There is no radiopaque foreign body. There is no acute fracture or dislocation. Joint spaces are maintained. IMPRESSION: 1. Dorsal hand soft tissue swelling.  No radiopaque foreign body. 2. No acute osseous abnormality. Electronically Signed   By: Ronney Asters M.D.   On: 01/30/2021 17:33    Procedures Procedures   Medications Ordered in ED Medications  rabies immune globulin (HYPERAB/KEDRAB) injection 1,650 Units (1,650 Units Intramuscular Given 01/30/21 2155)  rabies vaccine (RABAVERT) injection 1 mL (1 mL Intramuscular Given 01/30/21 2153)  amoxicillin-clavulanate (AUGMENTIN) 875-125 MG per tablet 1 tablet (1 tablet Oral Given 01/30/21 2149)  lidocaine-EPINEPHrine-tetracaine (LET) topical gel (3 mLs Topical Given 01/30/21 2146)    ED Course  I have reviewed the triage vital signs and the nursing notes.  Pertinent labs & imaging results that were available during my care of the patient were reviewed by me and considered in my medical decision making (see chart for details).    MDM Rules/Calculators/A&P                          This is a 70 year old female who presented to the ED today after she was bitten by a stray dog.  Her last tetanus  shot was in 2019.  There are no signs of fracture on left hand films today.  On exam, there are visible puncture marks from the bite, and the area is exquisitely tender, swollen, and ecchymotic.  Will administer rabies immunoglobulin and vaccine today. Given that the patient received her last tetanus shot less than 3 years ago and is confident that she received her last 3  tetanus vaccines in a timely fashion, will not need tetanus postexposure prophylaxis.  Will give Augmentin 875-125 mg in the ED, and she will take this for the next 14 days.   Patient received rabies shot in the ED today (day 0).  Patient will also need rabies vaccines on days 3, 7, and 14.  She was given instructions on how to obtain the rest of her rabies vaccines.  She was discharged in stable condition.   Final Clinical Impression(s) / ED Diagnoses Final diagnoses:  Dog bite of left hand, initial encounter    Rx / DC Orders ED Discharge Orders          Ordered    amoxicillin-clavulanate (AUGMENTIN) 875-125 MG tablet  2 times daily        01/30/21 2229             Orvis Brill, MD 01/30/21 2231    Tegeler, Gwenyth Allegra, MD 02/01/21 [phone]

## 2021-02-24 ENCOUNTER — Other Ambulatory Visit: Payer: Self-pay | Admitting: Gastroenterology

## 2021-06-11 NOTE — Progress Notes (Signed)
?Cardiology Office Note:   ?Date:  06/13/2021  ?NAME:  Beth Sawyer    ?MRN: [id number] ?DOB:  Jun 23, 1950  ? ?PCP:  Aura Dials, PA-C  ?Cardiologist:  None  ?Electrophysiologist:  None  ? ?Referring MD: Aura Dials, PA-C  ? ?Chief Complaint  ?Patient presents with  ? Follow-up  ?   ?  ? ? ?History of Present Illness:   ?Beth Sawyer is a 71 y.o. female with a hx of nonobstructive CAD, obesity, diabetes who presents for follow-up.  She reports she is doing well.  Denies any chest pain or trouble breathing.  Still having swelling in her left lower extremity.  This occurs periodically.  DVT study negative.  Venous reflux study negative.  Echocardiogram with no evidence of heart failure.  I suspect this is just poor circulation.  I have recommended low-salt diet as well as to continue her diuretic.  Primary care physician has placed her on HCTZ.  This is likely okay.  Blood pressures well controlled.  Diabetes seems to be well controlled.  All of her other risk factors are at goal.  Diabetes at goal.  LDL cholesterol at goal. ? ?Problem List ?1. HTN ?2. Obesity ?-BMI 35 ?-A1c 6.4 ?-T chol 128, triglycerides 103, HDL 63, LDL 46 ?-TSH 2.01 ?3. OSA ?4. Non-obstructive CAD ?-coronary calcium 53 (64th percentile) ?-<25% LAD ? ?Past Medical History: ?Past Medical History:  ?Diagnosis Date  ? Allergy   ? seasonal  ? Anxiety   ? Arthritis   ? neck, back and legs   ? Benign essential HTN 03/30/2014  ? Borderline diabetes   ? diet controlled  ? Cataract   ? bilateral removed  ? GERD (gastroesophageal reflux disease)   ? Glaucoma   ? Hyperlipidemia   ? Hypertension   ? OSA (obstructive sleep apnea) 02/28/2016  ? PUD (peptic ulcer disease) 03/30/2014  ? Sleep apnea   ? wear cpap  ? Thyroid disease   ? ? ?Past Surgical History: ?Past Surgical History:  ?Procedure Laterality Date  ? BREAST BIOPSY Right 1986  ? BREAST EXCISIONAL BIOPSY Right   ? benign  ? CATARACT EXTRACTION Right 2014  ? CATARACT EXTRACTION Left 02/28/2014  ?  [place]  ? x3  ? COLONOSCOPY    ? DILATION AND CURETTAGE OF UTERUS    ? X3  ? HEMORRHOID BANDING    ? HYSTEROSCOPY WITH D & C N/A 05/10/2012  ? Procedure: DILATATION AND CURETTAGE /HYSTEROSCOPY;  Surgeon: Gus Height, MD;  Location: [facility];  Service: Gynecology;  Laterality: N/A;  ? POLYPECTOMY    ? ? ?Current Medications: ?Current Meds  ?Medication Sig  ? albuterol (VENTOLIN HFA) 108 (90 Base) MCG/ACT inhaler SMARTSIG:2 Puff(s) By Mouth Every 4-6 Hours PRN  ? Calcium Carbonate-Vitamin D (CALTRATE 600+D PO) Take 1 tablet by mouth 2 (two) times daily.   ? cetirizine (ZYRTEC) 10 MG tablet Take 10 mg by mouth daily. Pt takes generic form of Zyrtec  ? Cholecalciferol (VITAMIN D3) 1000 UNITS CAPS Take 2,000 mg by mouth daily.  ? Cyanocobalamin (VITAMIN B12 PO) Take by mouth.  ? FLUoxetine (PROZAC) 10 MG capsule Take 10 mg by mouth daily.  ? gabapentin (NEURONTIN) 100 MG capsule Take 2 capsules (200 mg total) by mouth 2 (two) times daily.  ? hydrochlorothiazide (HYDRODIURIL) 25 MG tablet Take 25 mg by mouth daily.  ? hydrocortisone (ANUSOL-HC) 2.5 % rectal cream Place 1 application rectally 2 (two) times daily. (Patient  taking differently: Place 1 application. rectally as needed.)  ? lansoprazole (PREVACID) 30 MG capsule Take 1 capsule (30 mg total) by mouth daily.  ? levothyroxine (SYNTHROID) 75 MCG tablet Take 75 mcg by mouth daily.  ? losartan (COZAAR) 25 MG tablet Take 50 mg by mouth daily.   ? metoprolol succinate (TOPROL-XL) 25 MG 24 hr tablet Take 1 tablet by mouth once daily  ? Multiple Vitamin (MULITIVITAMIN WITH MINERALS) TABS Take 1 tablet by mouth every evening.   ? nystatin cream (MYCOSTATIN) nystatin 100,000 unit/gram topical cream ? APPLY CREAM TOPICALLY TO AFFECTED AREA TWICE DAILY  ? Omega-3 Fatty Acids (FISH OIL) 1000 MG CAPS Take 1 capsule by mouth 3 (three) times daily.   ? pantoprazole (PROTONIX) 40 MG tablet Take 1 tablet by mouth once daily  ? rosuvastatin (CRESTOR) 5 MG  tablet Take 5 mg by mouth every evening.   ? timolol (TIMOPTIC) 0.5 % ophthalmic solution Place 1 drop into both eyes daily.  ?  ? ?Allergies:    ?Morphine and related, Codeine, Oxycodone, Tramadol, and Tape  ? ?Social History: ?Social History  ? ?Socioeconomic History  ? Marital status: Widowed  ?  Spouse name: Not on file  ? Number of children: 3  ? Years of education: Not on file  ? Highest education level: Not on file  ?Occupational History  ? Occupation: caregiver  ?Tobacco Use  ? Smoking status: Never  ? Smokeless tobacco: Never  ?Vaping Use  ? Vaping Use: Never used  ?Substance and Sexual Activity  ? Alcohol use: No  ?  Comment: social/rare  ? Drug use: No  ? Sexual activity: Not on file  ?Other Topics Concern  ? Not on file  ?Social History Narrative  ? Not on file  ? ?Social Determinants of Health  ? ?Financial Resource Strain: Not on file  ?Food Insecurity: Not on file  ?Transportation Needs: Not on file  ?Physical Activity: Not on file  ?Stress: Not on file  ?Social Connections: Not on file  ?  ? ?Family History: ?The patient's family history includes Asthma in her sister; Brain cancer in her sister; Breast cancer in her sister and sister; COPD in her sister; Colon polyps in her sister; Congestive Heart Failure in her sister; Emphysema in her sister; Heart Problems in her mother; Heart disease in her brother and sister; Pancreatic cancer in her sister. There is no history of Colon cancer, Esophageal cancer, Rectal cancer, or Stomach cancer. ? ?ROS:   ?All other ROS reviewed and negative. Pertinent positives noted in the HPI.    ? ?EKGs/Labs/Other Studies Reviewed:   ?The following studies were personally reviewed by me today: ? ?TTE 12/27/2019 ? 1. Left ventricular ejection fraction, by estimation, is 60 to 65%. The  ?left ventricle has normal function. The left ventricle has no regional  ?wall motion abnormalities. There is mild concentric left ventricular  ?hypertrophy. Left ventricular diastolic   ?parameters are consistent with Grade I diastolic dysfunction (impaired  ?relaxation). Elevated left atrial pressure.  ? 2. Right ventricular systolic function is normal. The right ventricular  ?size is normal.  ? 3. The mitral valve is normal in structure. No evidence of mitral valve  ?regurgitation. No evidence of mitral stenosis.  ? 4. The aortic valve is normal in structure. Aortic valve regurgitation is  ?not visualized. No aortic stenosis is present.  ? 5. The inferior vena cava is normal in size with greater than 50%  ?respiratory variability, suggesting right atrial pressure of  3 mmHg.  ? ?CCTA 12/29/2019 ?IMPRESSION: ?1. Coronary calcium score of 52.7. This was 64th percentile for age, ?sex, and race matched control. ?  ?2. Normal coronary origin with right dominance.  Short left main. ?  ?3. CAD-RADS 1. Minimal non-obstructive CAD (1-24%). Consider ?non-atherosclerotic causes of chest pain. Consider preventive ?therapy and risk factor modification. ?  ?4. Atypical pulmonary vein drainage into the left atrium. ?  ?5. Sub-optimal contrast bolus, with timing favoring the right ?ventricle. This can occur with tricuspid regurgitation, though this ?does not correlate with other imaging studies obtained. ? ?Recent Labs: ?08/19/2020: ALT 27; BUN 14; Creatinine, Ser 0.76; Hemoglobin 12.3; Platelets 262.0; Potassium 4.0; Pro B Natriuretic peptide (BNP) 228.0; Sodium 133  ? ?Recent Lipid Panel ?No results found for: CHOL, TRIG, HDL, CHOLHDL, VLDL, LDLCALC, LDLDIRECT ? ?Physical Exam:   ?VS:  BP 118/72   Pulse 72   Ht '5\' 1"'$  (1.549 m)   Wt 188 lb 9.6 oz (85.5 kg)   SpO2 97%   BMI 35.64 kg/m?    ?Wt Readings from Last 3 Encounters:  ?06/13/21 188 lb 9.6 oz (85.5 kg)  ?01/30/21 184 lb (83.5 kg)  ?10/03/20 179 lb (81.2 kg)  ?  ?General: Well nourished, well developed, in no acute distress ?Head: Atraumatic, normal size  ?Eyes: PEERLA, EOMI  ?Neck: Supple, no JVD ?Endocrine: No thryomegaly ?Cardiac: Normal S1, S2; RRR;  no murmurs, rubs, or gallops ?Lungs: Clear to auscultation bilaterally, no wheezing, rhonchi or rales  ?Abd: Soft, nontender, no hepatomegaly  ?Ext: Trace edema in the left lower extremity ?Musculoskeletal: No defor

## 2021-06-13 ENCOUNTER — Encounter: Payer: Self-pay | Admitting: Cardiovascular Disease

## 2021-06-13 ENCOUNTER — Ambulatory Visit: Payer: Medicare Other | Admitting: Cardiovascular Disease

## 2021-06-13 VITALS — BP 118/72 | HR 72 | Ht 61.0 in | Wt 188.6 lb

## 2021-06-13 DIAGNOSIS — E782 Mixed hyperlipidemia: Secondary | ICD-10-CM

## 2021-06-13 DIAGNOSIS — R931 Abnormal findings on diagnostic imaging of heart and coronary circulation: Secondary | ICD-10-CM

## 2021-06-13 NOTE — Patient Instructions (Signed)
Medication Instructions:  The current medical regimen is effective;  continue present plan and medications.  *If you need a refill on your cardiac medications before your next appointment, please call your pharmacy*    Follow-Up: At [facility], you and your health needs are our priority.  As part of our continuing mission to provide you with exceptional heart care, we have created designated Provider Care Teams.  These Care Teams include your primary Cardiologist (physician) and Advanced Practice Providers (APPs -  Physician Assistants and Nurse Practitioners) who all work together to provide you with the care you need, when you need it.  We recommend signing up for the patient portal called "MyChart".  Sign up information is provided on this After Visit Summary.  MyChart is used to connect with patients for Virtual Visits (Telemedicine).  Patients are able to view lab/test results, encounter notes, upcoming appointments, etc.  Non-urgent messages can be sent to your provider as well.   To learn more about what you can do with MyChart, go to [email or web address].    Your next appointment:   As needed  The format for your next appointment:   In Person  Provider:   [place] O'Neal, MD      

## 2021-09-17 ENCOUNTER — Ambulatory Visit: Payer: Medicare Other | Admitting: Cardiovascular Disease

## 2021-09-17 ENCOUNTER — Encounter: Payer: Self-pay | Admitting: Cardiovascular Disease

## 2021-09-17 VITALS — BP 100/62 | HR 72 | Ht 61.0 in | Wt 177.0 lb

## 2021-09-17 DIAGNOSIS — I10 Essential (primary) hypertension: Secondary | ICD-10-CM | POA: Diagnosis not present

## 2021-09-17 DIAGNOSIS — G4733 Obstructive sleep apnea (adult) (pediatric): Secondary | ICD-10-CM | POA: Diagnosis not present

## 2021-09-17 DIAGNOSIS — E782 Mixed hyperlipidemia: Secondary | ICD-10-CM

## 2021-09-17 DIAGNOSIS — K219 Gastro-esophageal reflux disease without esophagitis: Secondary | ICD-10-CM

## 2021-09-17 DIAGNOSIS — R931 Abnormal findings on diagnostic imaging of heart and coronary circulation: Secondary | ICD-10-CM

## 2021-09-17 DIAGNOSIS — E039 Hypothyroidism, unspecified: Secondary | ICD-10-CM

## 2021-09-17 NOTE — Patient Instructions (Signed)
Medication Instructions:  The current medical regimen is effective;  continue present plan and medications.  *If you need a refill on your cardiac medications before your next appointment, please call your pharmacy*   Follow-Up: At [facility], you and your health needs are our priority.  As part of our continuing mission to provide you with exceptional heart care, we have created designated Provider Care Teams.  These Care Teams include your primary Cardiologist (physician) and Advanced Practice Providers (APPs -  Physician Assistants and Nurse Practitioners) who all work together to provide you with the care you need, when you need it.  We recommend signing up for the patient portal called "MyChart".  Sign up information is provided on this After Visit Summary.  MyChart is used to connect with patients for Virtual Visits (Telemedicine).  Patients are able to view lab/test results, encounter notes, upcoming appointments, etc.  Non-urgent messages can be sent to your provider as well.   To learn more about what you can do with MyChart, go to [email or web address].    Your next appointment:   12 month(s) or 90 days (if you get a new machine, let us know)  The format for your next appointment:   In Person  Provider:   Shelva Majestic, MD (sleep)        Please call Anderson Malta at [facility] [phone]. Get an appointment with her, she will look to see if you are eligible for a new machine. If so, we will see you back 90 days after receiving the new machine. Please call our office if this happens. Anderson Malta will also help you get set up for a new mask.

## 2021-09-17 NOTE — Progress Notes (Addendum)
Cardiology Office Note    Date:  09/23/2021   ID:  Beth Sawyer, DOB 1950/12/13, MRN [id number]  PCP:  Aura Dials, PA-C  Cardiologist:  Shelva Majestic, MD (sleep)  New sleep consultation/evaluation  History of Present Illness:  Beth Sawyer is a 71 y.o. female who has a history of who has a history of nonobstructive CAD, obesity, hypertension, hyperlipidemia, prediabetes, and has been diagnosed with obstructive sleep apnea with an in- lab polysomnogram on February 21, 2016.  Her study was read by Dr. Halford Chessman and she had mild overall sleep apnea with an AHI of 13.2/h however during REM sleep sleep apnea was severe at 52.3/h.  Oxygen desaturated to a nadir of 83% and she was noted to have moderate snoring.  Apparently, she has been on CPAP therapy since and has not been followed by Dr. Halford Chessman.  Is followed at [facility] and has seen Dia Sitter, PA apparently on several occasions.  Her DME company had been [employer] and tells me she had recently switched to [facility] for her DME provider.  She was referred to me to establish sleep care with me.  Presently, she goes to bed at around 1030 and wakes up at 7 AM.  Even though she has changed to [facility], her device was still linked to [employer].  [employer] was contacted today to allow her CPAP unit which is an ResMed air sense 10 AutoSet for her to be linked to our office.  I was able to obtain a download from April 26 September 15 2021.  She is meeting compliance standards of 89 of 90 days of use.  Average use is 9 hours and 6 minutes.  Her pressure setting is a range of 7 to 16 cm of water.  Her 95th percentile pressure is 10.2 with maximum average pressure at 11.  She has significant mask leak on a daily basis apparently she had been using an AirFit F30 little high mask.  She sleeps on her side.  States she is in need for an AirSense 10 filter.  Presently, while using CPAP she denies any breakthrough snoring.  Previously she had had morning  headaches which resolved.  An Epworth Sleepiness Scale score was calculated in the office today and this endorsed at 8 as shown below arguing against residual daytime sleepiness.   Epworth Sleepiness Scale: Situation   Chance of Dozing/Sleeping (0 = never , 1 = slight chance , 2 = moderate chance , 3 = high chance )   sitting and reading 3   watching TV 3   sitting inactive in a public place 0   being a passenger in a motor vehicle for an hour or more 0   lying down in the afternoon 1   sitting and talking to someone 0   sitting quietly after lunch (no alcohol) 1   while stopped for a few minutes in traffic as the driver 0   Total Score  8    She is unaware of any bruxism, restless legs, hypnopompic or hypnagogic hallucinations or cataplectic events.  She is on hydrochlorothiazide 25 mg daily, losartan 50 mg daily for hypertension and has a prescription for furosemide to take 20 mg as needed.  She is on levothyroxine for hypothyroidism at 75 mg.  She has GERD and is on Protonix.  Most recently she is now on atorvastatin 10 mg for hyperlipidemia.  She presents for my initial evaluation.  Past Medical History:  Diagnosis Date   Allergy    seasonal   Anxiety    Arthritis    neck, back and legs    Benign essential HTN 03/30/2014   Borderline diabetes    diet controlled   Cataract    bilateral removed   GERD (gastroesophageal reflux disease)    Glaucoma    Hyperlipidemia    Hypertension    OSA (obstructive sleep apnea) 02/28/2016   PUD (peptic ulcer disease) 03/30/2014   Sleep apnea    wear cpap   Thyroid disease     Past Surgical History:  Procedure Laterality Date   BREAST BIOPSY Right 1986   BREAST EXCISIONAL BIOPSY Right    benign   CATARACT EXTRACTION Right 2014   CATARACT EXTRACTION Left 02/28/2014   CESAREAN SECTION  1971, 1975, 1978   x3   COLONOSCOPY     DILATION AND CURETTAGE OF UTERUS     X3   HEMORRHOID BANDING     HYSTEROSCOPY WITH D & C N/A 05/10/2012    Procedure: DILATATION AND CURETTAGE /HYSTEROSCOPY;  Surgeon: Gus Height, MD;  Location: [facility];  Service: Gynecology;  Laterality: N/A;   POLYPECTOMY      Current Medications: Outpatient Medications Prior to Visit  Medication Sig Dispense Refill   albuterol (VENTOLIN HFA) 108 (90 Base) MCG/ACT inhaler SMARTSIG:2 Puff(s) By Mouth Every 4-6 Hours PRN     Calcium Carbonate-Vitamin D (CALTRATE 600+D PO) Take 1 tablet by mouth 2 (two) times daily.      cetirizine (ZYRTEC) 10 MG tablet Take 10 mg by mouth daily. Pt takes generic form of Zyrtec     Cholecalciferol (VITAMIN D3) 1000 UNITS CAPS Take 2,000 mg by mouth daily.     Cyanocobalamin (VITAMIN B12 PO) Take by mouth.     FLUoxetine (PROZAC) 10 MG capsule Take 10 mg by mouth daily.     gabapentin (NEURONTIN) 100 MG capsule Take 2 capsules (200 mg total) by mouth 2 (two) times daily. 180 capsule 3   hydrochlorothiazide (HYDRODIURIL) 25 MG tablet Take 25 mg by mouth daily.     hydrocortisone (ANUSOL-HC) 2.5 % rectal cream Place 1 application rectally 2 (two) times daily. (Patient taking differently: Place 1 application  rectally as needed.) 30 g 1   lansoprazole (PREVACID) 30 MG capsule Take 1 capsule (30 mg total) by mouth daily. 30 capsule 3   levothyroxine (SYNTHROID) 75 MCG tablet Take 75 mcg by mouth daily.     losartan (COZAAR) 25 MG tablet Take 50 mg by mouth daily.      metoprolol succinate (TOPROL-XL) 25 MG 24 hr tablet Take 1 tablet by mouth once daily     Multiple Vitamin (MULITIVITAMIN WITH MINERALS) TABS Take 1 tablet by mouth every evening.      nystatin cream (MYCOSTATIN) nystatin 100,000 unit/gram topical cream  APPLY CREAM TOPICALLY TO AFFECTED AREA TWICE DAILY     Omega-3 Fatty Acids (FISH OIL) 1000 MG CAPS Take 1 capsule by mouth 3 (three) times daily.      pantoprazole (PROTONIX) 40 MG tablet Take 1 tablet by mouth once daily 90 tablet 0   timolol (TIMOPTIC) 0.5 % ophthalmic solution Place 1 drop into both eyes daily.      rosuvastatin (CRESTOR) 5 MG tablet Take 5 mg by mouth every evening.      atorvastatin (LIPITOR) 10 MG tablet Take 10 mg by mouth daily.     furosemide (LASIX) 20 MG tablet Take 1 tablet (20 mg total) by mouth daily  as needed. 90 tablet 3   No facility-administered medications prior to visit.     Allergies:   Morphine and related, Codeine, Oxycodone, Tramadol, and Tape   Social History   Socioeconomic History   Marital status: Widowed    Spouse name: Not on file   Number of children: 3   Years of education: Not on file   Highest education level: Not on file  Occupational History   Occupation: caregiver  Tobacco Use   Smoking status: Never   Smokeless tobacco: Never  Vaping Use   Vaping Use: Never used  Substance and Sexual Activity   Alcohol use: No    Comment: social/rare   Drug use: No   Sexual activity: Not on file  Other Topics Concern   Not on file  Social History Narrative   Not on file   Social Determinants of Health   Financial Resource Strain: Not on file  Food Insecurity: Not on file  Transportation Needs: Not on file  Physical Activity: Not on file  Stress: Not on file  Social Connections: Not on file    Social history is notable that she was born in [place].  She is widowed and has 3 children and 3 great-grandchildren.  Family History:  The patient's family history includes Asthma in her sister; Brain cancer in her sister; Breast cancer in her sister and sister; COPD in her sister; Colon polyps in her sister; Congestive Heart Failure in her sister; Emphysema in her sister; Heart Problems in her mother; Heart disease in her brother and sister; Pancreatic cancer in her sister.  Father died at age 58 of questionable cause.  Mother is deceased.  Her parents had 6 girls and 2 boys other than herself.  All siblings are deceased.  ROS General: Negative; No fevers, chills, or night sweats;  HEENT: Negative; No changes in vision or hearing, sinus congestion,  difficulty swallowing Pulmonary: Negative; No cough, wheezing, shortness of breath, hemoptysis Cardiovascular: Positive for hypertension, hyperlipidemia GI: GERD GU: Negative; No dysuria, hematuria, or difficulty voiding Musculoskeletal: Negative; no myalgias, joint pain, or weakness Hematologic/Oncology: Negative; no easy bruising, bleeding Endocrine: Prediabetes, hypothyroidism Neuro: Negative; no changes in balance, headaches Skin: Negative; No rashes or skin lesions Psychiatric: Negative; No behavioral problems, depression Sleep: See HPI Other comprehensive 14 point system review is negative.   PHYSICAL EXAM:   VS:  BP 100/62 (BP Location: Left Arm, Patient Position: Sitting, Cuff Size: Large)   Pulse 72   Ht _0  (1.549 m)   Wt 177 lb (80.3 kg)   BMI 33.44 kg/m     Repeat blood pressure by me was 120/72  Wt Readings from Last 3 Encounters:  09/17/21 177 lb (80.3 kg)  06/13/21 188 lb 9.6 oz (85.5 kg)  01/30/21 184 lb (83.5 kg)    General: Alert, oriented, no distress.  Skin: normal turgor, no rashes, warm and dry HEENT: Normocephalic, atraumatic. Pupils equal round and reactive to light; sclera anicteric; extraocular muscles intact;  Nose without nasal septal hypertrophy Mouth/Parynx: Small oral orifice.  High arched palate.  Mallampati 3/4 Neck: No JVD, no carotid bruits; normal carotid upstroke Lungs: clear to ausculatation and percussion; no wheezing or rales Chest wall: without tenderness to palpitation Heart: PMI not displaced, RRR, s1 s2 normal, 1/6 systolic murmur, no diastolic murmur, no rubs, gallops, thrills, or heaves Abdomen: soft, nontender; no hepatosplenomehaly, BS+; abdominal aorta nontender and not dilated by palpation. Back: no CVA tenderness Pulses 2+ Musculoskeletal: full range of motion, normal  strength, no joint deformities Extremities: no clubbing cyanosis or edema, Homan's sign negative  Neurologic: grossly nonfocal; Cranial nerves grossly  wnl Psychologic: Normal mood and affect   Studies/Labs Reviewed:   September 17, 2021 ECG (independently read by me): NSR at 72, low voltage, no ectopy  Recent Labs:    Latest Ref Rng & Units 08/19/2020    2:02 PM 12/11/2019    3:42 PM 07/02/2017    6:05 PM  BMP  Glucose 70 - 99 mg/dL 122  90  109   BUN 6 - 23 mg/dL _0 Creatinine 0.40 - 1.20 mg/dL 0.76  0.91  0.74   BUN/Creat Ratio 12 - 28  12    Sodium 135 - 145 mEq/L 133  143  142   Potassium 3.5 - 5.1 mEq/L 4.0  4.3  4.0   Chloride 96 - 112 mEq/L 97  105  107   CO2 19 - 32 mEq/L _1 Calcium 8.4 - 10.5 mg/dL 9.5  9.7  9.5         Latest Ref Rng & Units 08/19/2020    2:02 PM 07/02/2017    6:05 PM  Hepatic Function  Total Protein 6.0 - 8.3 g/dL 7.7  8.1   Albumin 3.5 - 5.2 g/dL 4.3  4.6   AST 0 - 37 U/L 28  29   ALT 0 - 35 U/L 27  21   Alk Phosphatase 39 - 117 U/L 58  63   Total Bilirubin 0.2 - 1.2 mg/dL 0.4  0.6        Latest Ref Rng & Units 08/19/2020    2:02 PM 07/02/2017    6:05 PM 01/01/2014    9:10 AM  CBC  WBC 4.0 - 10.5 K/uL 7.3  8.2  5.6   Hemoglobin 12.0 - 15.0 g/dL 12.3  13.6  13.7   Hematocrit 36.0 - 46.0 % 36.7  41.6  41.4   Platelets 150.0 - 400.0 K/uL 262.0  302  282    Lab Results  Component Value Date   MCV 89.5 08/19/2020   MCV 95.2 07/02/2017   MCV 92.6 01/01/2014   No results found for: "TSH" No results found for: "HGBA1C"   BNP    Component Value Date/Time   BNP 53.7 12/11/2019 1542    ProBNP    Component Value Date/Time   PROBNP 228.0 (H) 08/19/2020 1402     Lipid Panel  No results found for: "CHOL", "TRIG", "HDL", "CHOLHDL", "VLDL", "LDLCALC", "LDLDIRECT", "LABVLDL"   RADIOLOGY: No results found.   Additional studies/ records that were reviewed today include:     Patient Name: Beth Sawyer, Beth Sawyer Date: 02/21/2016 Gender: Female D.O.B: 02-Mar-1950 Age (years): 23 Referring Provider: Chesley Mires MD, ABSM Height (inches): 60 Interpreting Physician:  Chesley Mires MD, ABSM Weight (lbs): 154 RPSGT: Jonna Coup BMI: 30 MRN: [id number] Neck Size: 15.00   CLINICAL INFORMATION Sleep Study Type: NPSG   Indication for sleep study: Morning Headaches, Snoring   Epworth Sleepiness Score: 8   SLEEP STUDY TECHNIQUE As per the AASM Manual for the Scoring of Sleep and Associated Events v2.3 (April 2016) with a hypopnea requiring 4% desaturations.   The channels recorded and monitored were frontal, central and occipital EEG, electrooculogram (EOG), submentalis EMG (chin), nasal and oral airflow, thoracic and abdominal wall motion, anterior tibialis EMG, snore microphone, electrocardiogram, and pulse oximetry.   MEDICATIONS Medications self-administered by patient taken the night of  the study : N/A   SLEEP ARCHITECTURE The study was initiated at 10:59:49 PM and ended at 5:00:27 AM.   Sleep onset time was 5.4 minutes and the sleep efficiency was 78.1%. The total sleep time was 281.7 minutes.   Stage REM latency was 265.0 minutes.   The patient spent 3.90% of the night in stage N1 sleep, 89.17% in stage N2 sleep, 0.00% in stage N3 and 6.92% in REM.   Alpha intrusion was absent.   Supine sleep was 60.52%.   RESPIRATORY PARAMETERS The overall apnea/hypopnea index (AHI) was 13.2 per hour. There were 0 total apneas, including 0 obstructive, 0 central and 0 mixed apneas. There were 62 hypopneas and 3 RERAs.   The AHI during Stage REM sleep was 52.3 per hour.   AHI while supine was 21.1 per hour.   The mean oxygen saturation was 92.60%. The minimum SpO2 during sleep was 83.00%.   Moderate snoring was noted during this study.   CARDIAC DATA The 2 lead EKG demonstrated sinus rhythm. The mean heart rate was 63.54 beats per minute. Other EKG findings include: None.   LEG MOVEMENT DATA The total PLMS were 3 with a resulting PLMS index of 0.64. Associated arousal with leg movement index was 0.9 .   IMPRESSIONS - Mild obstructive sleep  apnea occurred during this study (AHI = 13.2/h). - Mild oxygen desaturation was noted during this study (Min O2 = 83.00%).   DIAGNOSIS - Obstructive Sleep Apnea (327.23 [G47.33 ICD-10]).   RECOMMENDATIONS - Additional therapies include weight loss, CPAP, oral appliance, and surgical assessment.   [Electronically signed] 02/28/2016 02:06 PM   Chesley Mires MD, ABSM Diplomate, American Board of Sleep Medicine     ASSESSMENT:    1. OSA (obstructive sleep apnea)   2. Primary hypertension   3. Mixed hyperlipidemia   4. Hypothyroidism, unspecified type   5. Agatston coronary artery calcium score less than 100   6. Gastroesophageal reflux disease without esophagitis     PLAN:  Beth Sawyer is a 71 year old female history of nonobstructive CAD, obesity, hypertension, hyperlipidemia, hypothyroidism, and prediabetes.  In 2017 she was evaluated by Dr. Halford Chessman for obstructive sleep apnea and a diagnostic polysomnogram performed on February 20, 2026 confirmed mild overall sleep apnea with an AHI of 13.2/h; however during REM sleep her sleep apnea was severe with an AHI of 52.3.  Oxygen desaturated to a nadir of 83%.  Apparently, CPAP therapy was initiated and subsequently, she has been followed at [facility] by Nani Skillern, PA.  She sees Mattie Marlin, PA-C for primary care.  She had recently switched her DME company from [employer] to [employer].  She admits to compliance.  I was able to obtain a download of her device and had her device linked to our office for continued follow-up.  She is meeting compliance standards with 99% of usage days.  Average use was 9 hours and 6 minutes.  Her ResMed AirSense 10 AutoSet for her unit is set at a pressure range of 7 to 16 cm of water and her 95th percentile pressure is 10.2 with maximum average pressure of 11.0.  She has been sleeping on her side and avoid supine sleep.  She is in need for new filter.  She has been using her old mask and is in need  for a new mask.  I have recommended that she see Anderson Malta at [facility] for proper sizing and the correct mass that would be most beneficial.  Since this was my initial evaluation with her, I had a lengthy discussion with her regarding sleep apnea and its effects on normal sleep architecture.  Particularly, I also discussed potential adverse cardiovascular consequences if sleep apnea is not treated.  I discussed its implications with blood pressure control, potential for nocturnal arrhythmias, increased risk for atrial fibrillation if untreated.  In addition I discussed its effects on blood sugar with insulin resistance, increased inflammatory markers, as well as GERD.  With her GERD history, she has been taking pantoprazole.  I discussed sleeping on her left side would be more optimal to reduce reflux.  In addition I discussed potential nocturnal hypoxemia contributing to nocturnal ischemia both cardiac as well as cerebrovascular if atherosclerosis is present.  To this long discussion, if she was not aware of most of these associations.  I also discussed the pathophysiology associated with increased nocturia in patients with untreated sleep apnea.  Presently, her blood pressure is stable on current therapy.  When she gets a new mask, we will obtain a new download to see if there is significant improvement in her prior leak.  Discussed that she would qualify for new machine and I discussed the upgrade with the ResMed AirSense 11 AutoSet unit.  Her current machine is still functional but 42-1/72 years old.  I discussed current delay due to supply chain issues and it may take several months before a new machine is available.  If she does receive a new machine, I will need to see her within 90 days of initiation.  Otherwise I will see her in 1 year for follow-up evaluation.   Medication Adjustments/Labs and Tests Ordered: Current medicines are reviewed at length with the patient today.  Concerns regarding  medicines are outlined above.  Medication changes, Labs and Tests ordered today are listed in the Patient Instructions below. Patient Instructions  Medication Instructions:  The current medical regimen is effective;  continue present plan and medications.  *If you need a refill on your cardiac medications before your next appointment, please call your pharmacy*   Follow-Up: At [facility], you and your health needs are our priority.  As part of our continuing mission to provide you with exceptional heart care, we have created designated Provider Care Teams.  These Care Teams include your primary Cardiologist (physician) and Advanced Practice Providers (APPs -  Physician Assistants and Nurse Practitioners) who all work together to provide you with the care you need, when you need it.  We recommend signing up for the patient portal called "MyChart".  Sign up information is provided on this After Visit Summary.  MyChart is used to connect with patients for Virtual Visits (Telemedicine).  Patients are able to view lab/test results, encounter notes, upcoming appointments, etc.  Non-urgent messages can be sent to your provider as well.   To learn more about what you can do with MyChart, go to [email or web address].    Your next appointment:   12 month(s) or 90 days (if you get a new machine, let us know)  The format for your next appointment:   In Person  Provider:   Shelva Majestic, MD (sleep)        Please call Anderson Malta at [place] [phone]. Get an appointment with her, she will look to see if you are eligible for a new machine. If so, we will see you back 90 days after receiving the new machine. Please call our office if this happens. Anderson Malta will also help you get set  up for a new mask.     Signed, Shelva Majestic, MD,FACC, ABSM Diplomate, American Board of Sleep Medicine  09/23/2021 5:09 PM    [facility] [address], [place],  [place], [place]  [address] Phone: [phone]

## 2021-09-23 ENCOUNTER — Encounter: Payer: Self-pay | Admitting: Cardiovascular Disease

## 2021-10-21 ENCOUNTER — Other Ambulatory Visit: Payer: Self-pay | Admitting: Physician Assistant

## 2021-10-21 DIAGNOSIS — Z1231 Encounter for screening mammogram for malignant neoplasm of breast: Secondary | ICD-10-CM

## 2021-11-18 ENCOUNTER — Telehealth: Payer: Self-pay | Admitting: *Deleted

## 2021-11-18 NOTE — Telephone Encounter (Signed)
Received a call from the patient to inform me that Dr Claiborne Billings wanted her to receive a new CPAP machine and she has not heard anything about it. She has left several messages with [facility] without any response. I will reach out to [facility] for update.

## 2021-11-18 NOTE — Telephone Encounter (Signed)
CPAP replacement order sent to [facility].

## 2021-12-01 ENCOUNTER — Ambulatory Visit: Payer: Medicare Other

## 2021-12-09 ENCOUNTER — Other Ambulatory Visit (HOSPITAL_BASED_OUTPATIENT_CLINIC_OR_DEPARTMENT_OTHER): Payer: Self-pay

## 2021-12-09 MED ORDER — OZEMPIC (2 MG/DOSE) 8 MG/3ML ~~LOC~~ SOPN
2.0000 mg | PEN_INJECTOR | SUBCUTANEOUS | 0 refills | Status: DC
  Filled 2021-12-09: qty 3, 28d supply, fill #0

## 2021-12-18 ENCOUNTER — Other Ambulatory Visit (HOSPITAL_BASED_OUTPATIENT_CLINIC_OR_DEPARTMENT_OTHER): Payer: Self-pay

## 2021-12-18 MED ORDER — OZEMPIC (2 MG/DOSE) 8 MG/3ML ~~LOC~~ SOPN
PEN_INJECTOR | SUBCUTANEOUS | 0 refills | Status: DC
  Filled 2021-12-18: qty 3, 28d supply, fill #0
  Filled 2021-12-23: qty 3, fill #0
  Filled 2021-12-24: qty 3, 28d supply, fill #0

## 2021-12-19 ENCOUNTER — Other Ambulatory Visit (HOSPITAL_BASED_OUTPATIENT_CLINIC_OR_DEPARTMENT_OTHER): Payer: Self-pay

## 2021-12-23 ENCOUNTER — Other Ambulatory Visit (HOSPITAL_BASED_OUTPATIENT_CLINIC_OR_DEPARTMENT_OTHER): Payer: Self-pay

## 2021-12-24 ENCOUNTER — Other Ambulatory Visit (HOSPITAL_BASED_OUTPATIENT_CLINIC_OR_DEPARTMENT_OTHER): Payer: Self-pay

## 2021-12-30 ENCOUNTER — Ambulatory Visit: Payer: Medicare Other

## 2022-01-08 IMAGING — DX DG SHOULDER 2+V*L*
3 series · 3 of 3 positions shown · non-contrast
Comparison: October 16, 2019

CLINICAL DATA: Left shoulder pain.

EXAM:
LEFT SHOULDER - 2+ VIEW

[shoulder ap (1 of 2)]
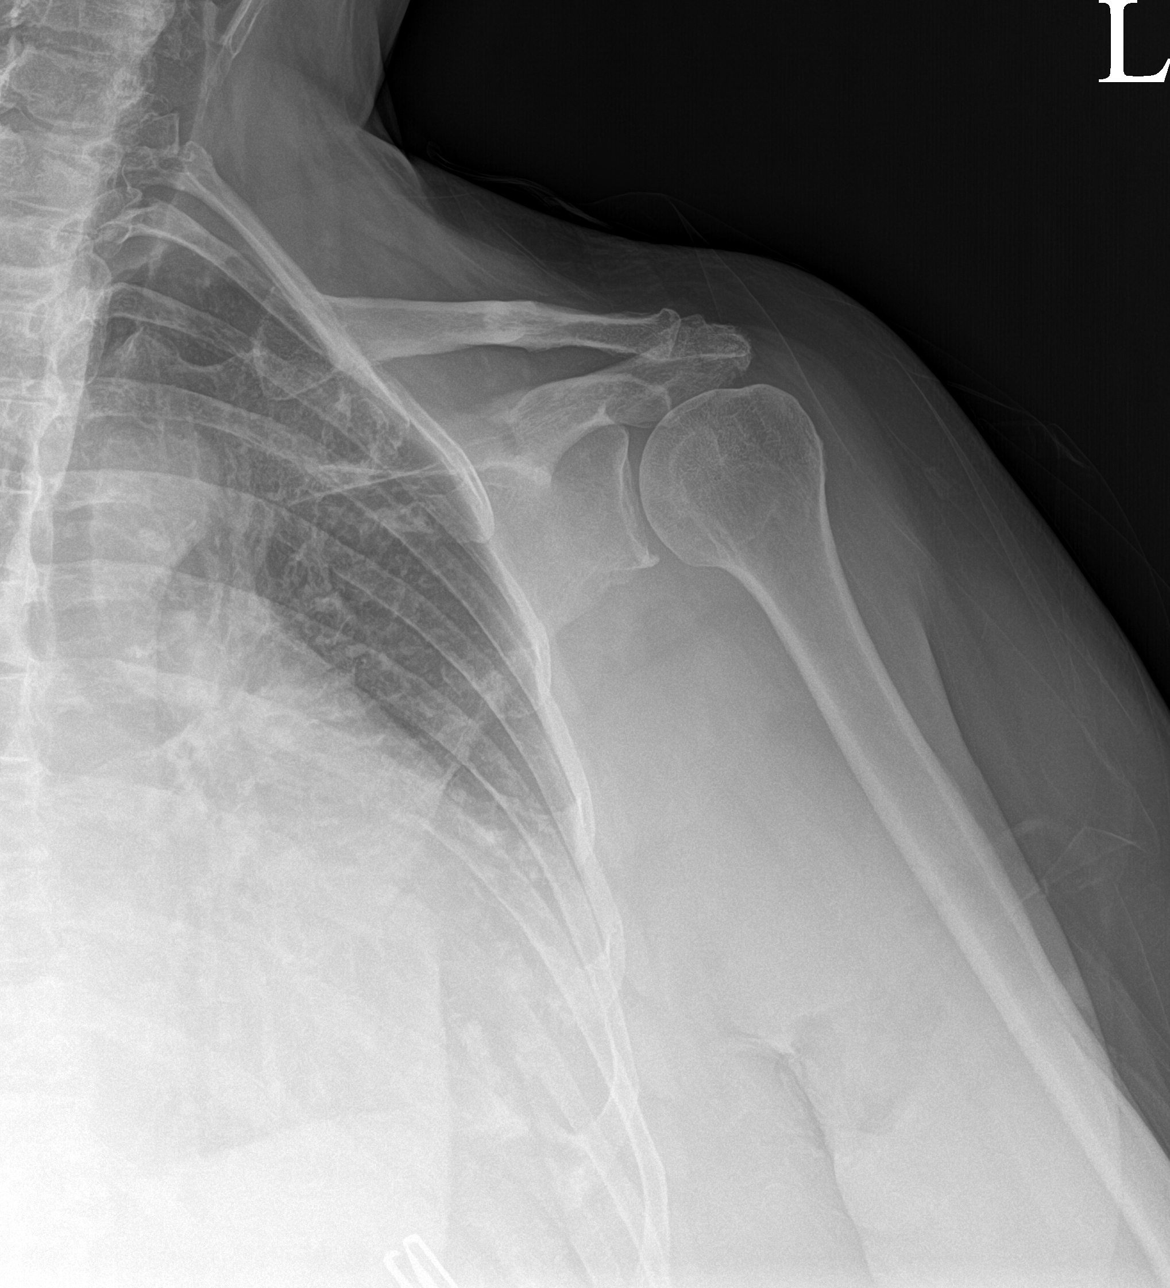

[shoulder ap (2 of 2)]
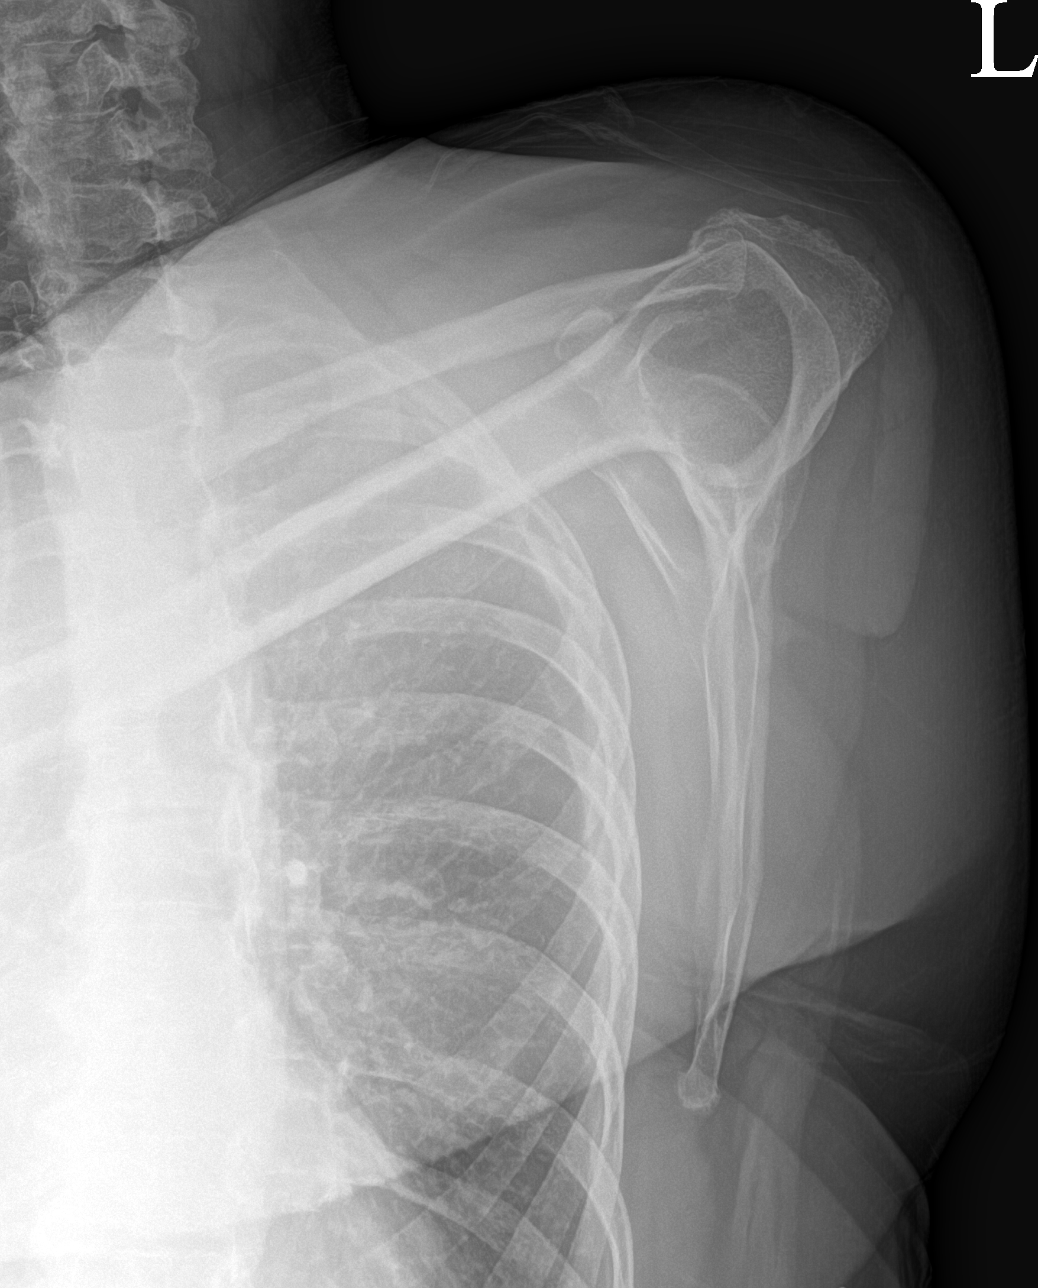

[shoulder axial]
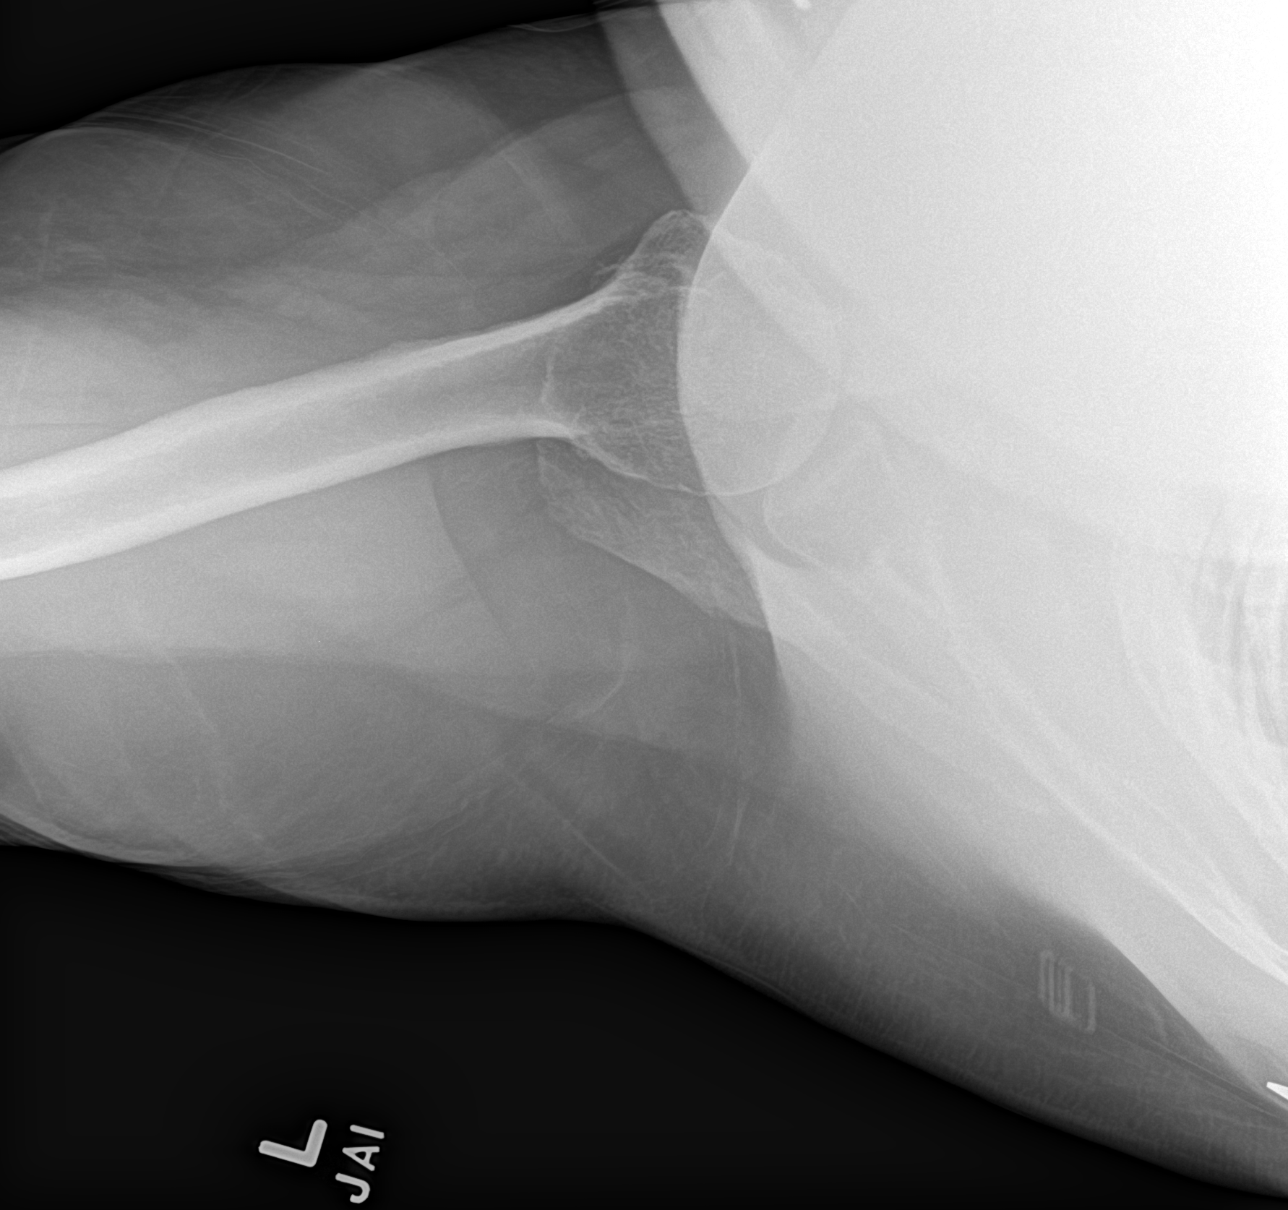

[3 of 3 positions shown; findings below may reference images not displayed]

FINDINGS: There is no evidence of fracture or dislocation. Moderate severity
degenerative changes seen involving the left acromioclavicular joint
and left glenohumeral articulation. Soft tissues are unremarkable.
IMPRESSION: Degenerative changes without an acute osseous abnormality.

## 2022-02-11 ENCOUNTER — Encounter: Payer: Self-pay | Admitting: Cardiovascular Disease

## 2022-02-11 ENCOUNTER — Ambulatory Visit: Payer: Medicare Other | Attending: Cardiovascular Disease | Admitting: Cardiovascular Disease

## 2022-02-11 VITALS — BP 110/64 | HR 70 | Ht 61.0 in | Wt 165.2 lb

## 2022-02-11 DIAGNOSIS — R931 Abnormal findings on diagnostic imaging of heart and coronary circulation: Secondary | ICD-10-CM | POA: Diagnosis not present

## 2022-02-11 DIAGNOSIS — G4733 Obstructive sleep apnea (adult) (pediatric): Secondary | ICD-10-CM

## 2022-02-11 DIAGNOSIS — E039 Hypothyroidism, unspecified: Secondary | ICD-10-CM

## 2022-02-11 DIAGNOSIS — I10 Essential (primary) hypertension: Secondary | ICD-10-CM

## 2022-02-11 DIAGNOSIS — E782 Mixed hyperlipidemia: Secondary | ICD-10-CM

## 2022-02-11 DIAGNOSIS — K219 Gastro-esophageal reflux disease without esophagitis: Secondary | ICD-10-CM

## 2022-02-11 NOTE — Progress Notes (Signed)
Cardiology Office Note    Date:  02/14/2022   ID:  KATREENA SCHUPP, DOB 01-Sep-1950, MRN [id number]  PCP:  Aura Dials, PA-C  Cardiologist:  Shelva Majestic, MD (sleep); Dr. Audie Box  4 month F/U  sleep /evaluation  History of Present Illness:  BEVIN DAS is a 71 y.o. female who has a history of who has a history of nonobstructive CAD, obesity, hypertension, hyperlipidemia, prediabetes, and has been diagnosed with obstructive sleep apnea with an in- lab polysomnogram on February 21, 2016.  Her study was read by Dr. Halford Chessman and she had mild overall sleep apnea with an AHI of 13.2/h however during REM sleep sleep apnea was severe at 52.3/h.  Oxygen desaturated to a nadir of 83% and she was noted to have moderate snoring.  Apparently, she has been on CPAP therapy since and has not been followed by Dr. Halford Chessman.  Is followed at [facility] and has seen Dia Sitter, PA apparently on several occasions.  Her DME company had been [employer] and tells me she had recently switched to [facility] for her DME provider.  She was referred to me to establish sleep care with me.  Presently, she goes to bed at around 1030 and wakes up at 7 AM.  Even though she has changed to [facility], her device was still linked to [employer].  [employer] was contacted today to allow her CPAP unit which is an ResMed air sense 10 AutoSet for her to be linked to our office.  I was able to obtain a download from April 26 September 15 2021.  She is meeting compliance standards of 89 of 90 days of use.  Average use is 9 hours and 6 minutes.  Her pressure setting is a range of 7 to 16 cm of water.  Her 95th percentile pressure is 10.2 with maximum average pressure at 11.  She has significant mask leak on a daily basis apparently she had been using an AirFit F30 little high mask.  She sleeps on her side.  States she is in need for an AirSense 10 filter.  Presently, while using CPAP she denies any breakthrough snoring.  Previously she  had had morning headaches which resolved.  An Epworth Sleepiness Scale score was calculated in the office today and this endorsed at 8 as shown below arguing against residual daytime sleepiness.   Epworth Sleepiness Scale: Situation   Chance of Dozing/Sleeping (0 = never , 1 = slight chance , 2 = moderate chance , 3 = high chance )   sitting and reading 3   watching TV 3   sitting inactive in a public place 0   being a passenger in a motor vehicle for an hour or more 0   lying down in the afternoon 1   sitting and talking to someone 0   sitting quietly after lunch (no alcohol) 1   while stopped for a few minutes in traffic as the driver 0   Total Score  8    She is unaware of any bruxism, restless legs, hypnopompic or hypnagogic hallucinations or cataplectic events.  She is on hydrochlorothiazide 25 mg daily, losartan 50 mg daily for hypertension and has a prescription for furosemide to take 20 mg as needed.  She is on levothyroxine for hypothyroidism at 75 mg.  She has GERD and is on Protonix.  Most recently she is now on atorvastatin 10 mg for hyperlipidemia.  During her initial sleep evaluation with me I had a lengthy discussion with her regarding sleep apnea and its effects on normal sleep architecture.  I discussed potential adverse cardiovascular consequences if sleep apnea is not treated.  I discussed its implications with blood pressure control, potential for nocturnal arrhythmias, increased risk for atrial fibrillation if untreated and its negative its effects on blood sugar with insulin resistance, increased inflammatory markers, as well as GERD.  With her GERD history, she has been taking pantoprazole.  I discussed sleeping on her left side would be more optimal to reduce reflux.  In addition I discussed potential nocturnal hypoxemia contributing to nocturnal ischemia both cardiac as well as cerebrovascular if atherosclerosis is present.  I also discussed the pathophysiology  associated with increased nocturia in patients with untreated sleep apnea.  She was unaware of the majority of these associations.  She qualified for new CPAP device.  She received a new ResMed AirSense 11 AutoSet unit on December 30, 2021 as a replacement machine.  [employer] is now her DME company.  I obtained a download from November 20 through February 10, 2022.  Compliance is excellent with average use at 8 hours and 59 minutes.  She has significant leak on a daily basis with 95th percentile leak at 107.5 L/min with a threshold at 24.0 L/min.  Her 95th percentile pressure is 11.6 with maximum average pressure 13.3.  AHI was increased probably contributed by the significant leak at 5.6/h.  She has been using a ResMed AirFit F30i mask but seems to have leakage around the nasal portion.  Her mask was a medium size.  Presently she feels well with her new machine and noted improvement from her previous CPAP device.  She denies any residual daytime sleepiness or awareness of snoring.  She presents for evaluation.  Past Medical History:  Diagnosis Date   Allergy    seasonal   Anxiety    Arthritis    neck, back and legs    Benign essential HTN 03/30/2014   Borderline diabetes    diet controlled   Cataract    bilateral removed   GERD (gastroesophageal reflux disease)    Glaucoma    Hyperlipidemia    Hypertension    OSA (obstructive sleep apnea) 02/28/2016   PUD (peptic ulcer disease) 03/30/2014   Sleep apnea    wear cpap   Thyroid disease     Past Surgical History:  Procedure Laterality Date   BREAST BIOPSY Right 1986   BREAST EXCISIONAL BIOPSY Right    benign   CATARACT EXTRACTION Right 2014   CATARACT EXTRACTION Left 02/28/2014   CESAREAN SECTION  1971, 1975, 1978   x3   COLONOSCOPY     DILATION AND CURETTAGE OF UTERUS     X3   HEMORRHOID BANDING     HYSTEROSCOPY WITH D & C N/A 05/10/2012   Procedure: DILATATION AND CURETTAGE /HYSTEROSCOPY;  Surgeon: Gus Height, MD;  Location: [facility];   Service: Gynecology;  Laterality: N/A;   POLYPECTOMY      Current Medications: Outpatient Medications Prior to Visit  Medication Sig Dispense Refill   albuterol (VENTOLIN HFA) 108 (90 Base) MCG/ACT inhaler SMARTSIG:2 Puff(s) By Mouth Every 4-6 Hours PRN     atorvastatin (LIPITOR) 10 MG tablet Take 10 mg by mouth daily.     Calcium Carbonate-Vitamin D (CALTRATE 600+D PO) Take 1 tablet by mouth 2 (two) times daily.      cetirizine (ZYRTEC) 10 MG tablet Take 10 mg by  mouth daily. Pt takes generic form of Zyrtec     Cholecalciferol (VITAMIN D3) 1000 UNITS CAPS Take 2,000 mg by mouth daily.     Cyanocobalamin (VITAMIN B12 PO) Take by mouth.     FLUoxetine (PROZAC) 10 MG capsule Take 10 mg by mouth daily.     furosemide (LASIX) 20 MG tablet Take 1 tablet (20 mg total) by mouth daily as needed. 90 tablet 3   gabapentin (NEURONTIN) 100 MG capsule Take 2 capsules (200 mg total) by mouth 2 (two) times daily. 180 capsule 3   hydrochlorothiazide (HYDRODIURIL) 25 MG tablet Take 25 mg by mouth daily.     hydrocortisone (ANUSOL-HC) 2.5 % rectal cream Place 1 application rectally 2 (two) times daily. (Patient taking differently: Place 1 application  rectally as needed.) 30 g 1   lansoprazole (PREVACID) 30 MG capsule Take 1 capsule (30 mg total) by mouth daily. 30 capsule 3   levothyroxine (SYNTHROID) 75 MCG tablet Take 75 mcg by mouth daily.     losartan (COZAAR) 25 MG tablet Take 50 mg by mouth daily.      metoprolol succinate (TOPROL-XL) 25 MG 24 hr tablet Take 1 tablet by mouth once daily     Multiple Vitamin (MULITIVITAMIN WITH MINERALS) TABS Take 1 tablet by mouth every evening.      nystatin cream (MYCOSTATIN) nystatin 100,000 unit/gram topical cream  APPLY CREAM TOPICALLY TO AFFECTED AREA TWICE DAILY     Omega-3 Fatty Acids (FISH OIL) 1000 MG CAPS Take 1 capsule by mouth 3 (three) times daily.      pantoprazole (PROTONIX) 40 MG tablet Take 1 tablet by mouth once daily 90 tablet 0   Semaglutide, 2  MG/DOSE, (OZEMPIC, 2 MG/DOSE,) 8 MG/3ML SOPN Inject 2 mg into the skin once a week. 3 mL 0   timolol (TIMOPTIC) 0.5 % ophthalmic solution Place 1 drop into both eyes daily.     tiZANidine (ZANAFLEX) 2 MG tablet Take 2 mg by mouth at bedtime.     No facility-administered medications prior to visit.     Allergies:   Morphine and related, Codeine, Oxycodone, Tramadol, and Tape   Social History   Socioeconomic History   Marital status: Widowed    Spouse name: Not on file   Number of children: 3   Years of education: Not on file   Highest education level: Not on file  Occupational History   Occupation: caregiver  Tobacco Use   Smoking status: Never   Smokeless tobacco: Never  Vaping Use   Vaping Use: Never used  Substance and Sexual Activity   Alcohol use: No    Comment: social/rare   Drug use: No   Sexual activity: Not on file  Other Topics Concern   Not on file  Social History Narrative   Not on file   Social Determinants of Health   Financial Resource Strain: Not on file  Food Insecurity: Not on file  Transportation Needs: Not on file  Physical Activity: Not on file  Stress: Not on file  Social Connections: Not on file    Social history is notable that she was born in [place].  She is widowed and has 3 children and 3 great-grandchildren.  Family History:  The patient's family history includes Asthma in her sister; Brain cancer in her sister; Breast cancer in her sister and sister; COPD in her sister; Colon polyps in her sister; Congestive Heart Failure in her sister; Emphysema in her sister; Heart Problems in her mother; Heart  disease in her brother and sister; Pancreatic cancer in her sister.  Father died at age 45 of questionable cause.  Mother is deceased.  Her parents had 6 girls and 2 boys other than herself.  All siblings are deceased.  ROS General: Negative; No fevers, chills, or night sweats;  HEENT: Negative; No changes in vision or hearing, sinus  congestion, difficulty swallowing Pulmonary: Negative; No cough, wheezing, shortness of breath, hemoptysis Cardiovascular: Positive for hypertension, hyperlipidemia GI: GERD GU: Negative; No dysuria, hematuria, or difficulty voiding Musculoskeletal: Negative; no myalgias, joint pain, or weakness Hematologic/Oncology: Negative; no easy bruising, bleeding Endocrine: Prediabetes, hypothyroidism Neuro: Negative; no changes in balance, headaches Skin: Negative; No rashes or skin lesions Psychiatric: Negative; No behavioral problems, depression Sleep: See HPI Other comprehensive 14 point system review is negative.   PHYSICAL EXAM:   VS:  BP 110/64   Pulse 70   Ht 5' 1" (1.549 m)   Wt 165 lb 3.2 oz (74.9 kg)   SpO2 96%   BMI 31.21 kg/m     Repeat blood pressure by me was 120/72  Wt Readings from Last 3 Encounters:  02/11/22 165 lb 3.2 oz (74.9 kg)  09/17/21 177 lb (80.3 kg)  06/13/21 188 lb 9.6 oz (85.5 kg)    General: Alert, oriented, no distress.  Skin: normal turgor, no rashes, warm and dry HEENT: Normocephalic, atraumatic. Pupils equal round and reactive to light; sclera anicteric; extraocular muscles intact;  Nose without nasal septal hypertrophy Mouth/Parynx: Small oral orifice.  High arched palate.  Mallampati 3/4 Neck: No JVD, no carotid bruits; normal carotid upstroke Lungs: clear to ausculatation and percussion; no wheezing or rales Chest wall: without tenderness to palpitation Heart: PMI not displaced, RRR, s1 s2 normal, 1/6 systolic murmur, no diastolic murmur, no rubs, gallops, thrills, or heaves Abdomen: soft, nontender; no hepatosplenomehaly, BS+; abdominal aorta nontender and not dilated by palpation. Back: no CVA tenderness Pulses 2+ Musculoskeletal: full range of motion, normal strength, no joint deformities Extremities: no clubbing cyanosis or edema, Homan's sign negative  Neurologic: grossly nonfocal; Cranial nerves grossly wnl Psychologic: Normal mood and  affect   Studies/Labs Reviewed:   February 11, 2022 ECG (independently read by me): NSR at 69  September 17, 2021 ECG (independently read by me): NSR at 72, low voltage, no ectopy  Recent Labs:    Latest Ref Rng & Units 08/19/2020    2:02 PM 12/11/2019    3:42 PM 07/02/2017    6:05 PM  BMP  Glucose 70 - 99 mg/dL 122  90  109   BUN 6 - 23 mg/dL _0 Creatinine 0.40 - 1.20 mg/dL 0.76  0.91  0.74   BUN/Creat Ratio 12 - 28  12    Sodium 135 - 145 mEq/L 133  143  142   Potassium 3.5 - 5.1 mEq/L 4.0  4.3  4.0   Chloride 96 - 112 mEq/L 97  105  107   CO2 19 - 32 mEq/L _1 Calcium 8.4 - 10.5 mg/dL 9.5  9.7  9.5         Latest Ref Rng & Units 08/19/2020    2:02 PM 07/02/2017    6:05 PM  Hepatic Function  Total Protein 6.0 - 8.3 g/dL 7.7  8.1   Albumin 3.5 - 5.2 g/dL 4.3  4.6   AST 0 - 37 U/L 28  29   ALT 0 - 35 U/L 27  21  Alk Phosphatase 39 - 117 U/L 58  63   Total Bilirubin 0.2 - 1.2 mg/dL 0.4  0.6        Latest Ref Rng & Units 08/19/2020    2:02 PM 07/02/2017    6:05 PM 01/01/2014    9:10 AM  CBC  WBC 4.0 - 10.5 K/uL 7.3  8.2  5.6   Hemoglobin 12.0 - 15.0 g/dL 12.3  13.6  13.7   Hematocrit 36.0 - 46.0 % 36.7  41.6  41.4   Platelets 150.0 - 400.0 K/uL 262.0  302  282    Lab Results  Component Value Date   MCV 89.5 08/19/2020   MCV 95.2 07/02/2017   MCV 92.6 01/01/2014   No results found for: "TSH" No results found for: "HGBA1C"   BNP    Component Value Date/Time   BNP 53.7 12/11/2019 1542    ProBNP    Component Value Date/Time   PROBNP 228.0 (H) 08/19/2020 1402     Lipid Panel  No results found for: "CHOL", "TRIG", "HDL", "CHOLHDL", "VLDL", "LDLCALC", "LDLDIRECT", "LABVLDL"   RADIOLOGY: No results found.   Additional studies/ records that were reviewed today include:     Patient Name: Elishia, Kaczorowski Date: 02/21/2016 Gender: Female D.O.B: 10-17-1950 Age (years): 56 Referring Provider: Chesley Mires MD, ABSM Height  (inches): 60 Interpreting Physician: Chesley Mires MD, ABSM Weight (lbs): 154 RPSGT: Jonna Coup BMI: 30 MRN: [id number] Neck Size: 15.00   CLINICAL INFORMATION Sleep Study Type: NPSG   Indication for sleep study: Morning Headaches, Snoring   Epworth Sleepiness Score: 8   SLEEP STUDY TECHNIQUE As per the AASM Manual for the Scoring of Sleep and Associated Events v2.3 (April 2016) with a hypopnea requiring 4% desaturations.   The channels recorded and monitored were frontal, central and occipital EEG, electrooculogram (EOG), submentalis EMG (chin), nasal and oral airflow, thoracic and abdominal wall motion, anterior tibialis EMG, snore microphone, electrocardiogram, and pulse oximetry.   MEDICATIONS Medications self-administered by patient taken the night of the study : N/A   SLEEP ARCHITECTURE The study was initiated at 10:59:49 PM and ended at 5:00:27 AM.   Sleep onset time was 5.4 minutes and the sleep efficiency was 78.1%. The total sleep time was 281.7 minutes.   Stage REM latency was 265.0 minutes.   The patient spent 3.90% of the night in stage N1 sleep, 89.17% in stage N2 sleep, 0.00% in stage N3 and 6.92% in REM.   Alpha intrusion was absent.   Supine sleep was 60.52%.   RESPIRATORY PARAMETERS The overall apnea/hypopnea index (AHI) was 13.2 per hour. There were 0 total apneas, including 0 obstructive, 0 central and 0 mixed apneas. There were 62 hypopneas and 3 RERAs.   The AHI during Stage REM sleep was 52.3 per hour.   AHI while supine was 21.1 per hour.   The mean oxygen saturation was 92.60%. The minimum SpO2 during sleep was 83.00%.   Moderate snoring was noted during this study.   CARDIAC DATA The 2 lead EKG demonstrated sinus rhythm. The mean heart rate was 63.54 beats per minute. Other EKG findings include: None.   LEG MOVEMENT DATA The total PLMS were 3 with a resulting PLMS index of 0.64. Associated arousal with leg movement index was 0.9 .    IMPRESSIONS - Mild obstructive sleep apnea occurred during this study (AHI = 13.2/h). - Mild oxygen desaturation was noted during this study (Min O2 = 83.00%).   DIAGNOSIS - Obstructive Sleep Apnea (327.23 [  G47.33 ICD-10]).   RECOMMENDATIONS - Additional therapies include weight loss, CPAP, oral appliance, and surgical assessment.   [Electronically signed] 02/28/2016 02:06 PM   Chesley Mires MD, ABSM Diplomate, American Board of Sleep Medicine     ASSESSMENT:    1. OSA (obstructive sleep apnea)   2. Primary hypertension   3. Agatston coronary artery calcium score less than 100   4. Mixed hyperlipidemia   5. Hypothyroidism, unspecified type   6. Gastroesophageal reflux disease without esophagitis      PLAN:  Ms. Tabbatha Bordelon is a 71 year old female history of nonobstructive CAD, obesity, hypertension, hyperlipidemia, hypothyroidism, and prediabetes.  In 2017 she was evaluated by Dr. Halford Chessman for obstructive sleep apnea and a diagnostic polysomnogram performed on February 20, 2026 confirmed mild overall sleep apnea with an AHI of 13.2/h; however during REM sleep her sleep apnea was severe with an AHI of 52.3.  Oxygen desaturated to a nadir of 83%.  Apparently, CPAP therapy was initiated and subsequently, she has been followed at [facility] by Nani Skillern, PA.  She sees Mattie Marlin, PA-C for primary care.  Previously, she had switched her DME company from [facility] [place] to [facility].  She admits to compliance.  I was able to obtain a download of her device and had her device linked to our office for continued follow-up.  When I saw her for my initial sleep evaluation on September 17, 2021, she was compliant.  She qualified for a new machine.  She was having issues with her old mask.  During that evaluation I had an extensive discussion with her regarding sleep apnea and discussed in detail as noted above potential adverse cardiovascular consequences of left untreated.  She qualified for new  replacement machine and received a new ResMed AirSense 11 AutoSet unit on December 30, 2021.  She is meeting compliance standards.  She has been sleeping of just under 9 hours per night with 100% use.  Her AHI is elevated at 5.6 most likely contributed by significant daily mask leak.  She has been using a medium F30i mask.  In the office today I provided her with a sample of the F30i small size and also an alternative AirTouch F20 mask which she may tolerate well and avoid significant leak.  She is sleeping well.  She denies residual snoring or daytime sleepiness.  She denies restless legs.  Her blood pressure today is stable at 110/62 on losartan 50 mg, metoprolol succinate 25 mg, HCTZ 25 mg.  She is on atorvastatin for hyperlipidemia.  She is on levothyroxine for hypothyroidism.  She is on Ozempic.  She will follow-up cardiology with Dr. Davina Poke.  Mattie Marlin, PA-C is her primary provider.  I will see her in 1 year for reevaluation or sooner as needed.  Medication Adjustments/Labs and Tests Ordered: Current medicines are reviewed at length with the patient today.  Concerns regarding medicines are outlined above.  Medication changes, Labs and Tests ordered today are listed in the Patient Instructions below. Patient Instructions  Medication Instructions:  The current medical regimen is effective;  continue present plan and medications as directed. Please refer to the Current Medication list given to you today.  *If you need a refill on your cardiac medications before your next appointment, please call your pharmacy*  Lab Work: NONE  Testing/Procedures: NONE  Follow-Up: At [facility], you and your health needs are our priority.  As part of our continuing mission to provide you with exceptional heart care, we  have created designated Provider Care Teams.  These Care Teams include your primary Cardiologist (physician) and Advanced Practice Providers (APPs -  Physician Assistants and Nurse  Practitioners) who all work together to provide you with the care you need, when you need it.  Your next appointment:   12 month(s)  The format for your next appointment:   In Person  Provider:   DR Charlcie Cradle     Other Instructions   Signed, Shelva Majestic, MD,FACC, ABSM Diplomate, American Board of Sleep Medicine  02/14/2022 5:01 PM    [facility] [address], [place], [place], [place]  [address] Phone: [phone]

## 2022-02-11 NOTE — Patient Instructions (Signed)
Medication Instructions:  The current medical regimen is effective;  continue present plan and medications as directed. Please refer to the Current Medication list given to you today.  *If you need a refill on your cardiac medications before your next appointment, please call your pharmacy*  Lab Work: NONE  Testing/Procedures: NONE  Follow-Up: At [facility], you and your health needs are our priority.  As part of our continuing mission to provide you with exceptional heart care, we have created designated Provider Care Teams.  These Care Teams include your primary Cardiologist (physician) and Advanced Practice Providers (APPs -  Physician Assistants and Nurse Practitioners) who all work together to provide you with the care you need, when you need it.  Your next appointment:   12 month(s)  The format for your next appointment:   In Person  Provider:   DR Charlcie Cradle     Other Instructions

## 2022-02-14 ENCOUNTER — Encounter: Payer: Self-pay | Admitting: Cardiovascular Disease

## 2022-02-24 ENCOUNTER — Other Ambulatory Visit (HOSPITAL_BASED_OUTPATIENT_CLINIC_OR_DEPARTMENT_OTHER): Payer: Self-pay

## 2022-02-24 ENCOUNTER — Other Ambulatory Visit (HOSPITAL_COMMUNITY): Payer: Self-pay

## 2022-02-24 MED ORDER — OZEMPIC (2 MG/DOSE) 8 MG/3ML ~~LOC~~ SOPN
2.0000 mg | PEN_INJECTOR | SUBCUTANEOUS | 0 refills | Status: DC
  Filled 2022-02-24: qty 3, 28d supply, fill #0

## 2022-02-24 MED ORDER — AREXVY 120 MCG/0.5ML IM SUSR
INTRAMUSCULAR | 0 refills | Status: AC
  Filled 2022-02-24: qty 0.5, 1d supply, fill #0

## 2022-02-25 ENCOUNTER — Telehealth: Payer: Self-pay | Admitting: *Deleted

## 2022-02-25 NOTE — Telephone Encounter (Signed)
Patient called in to say that she is having too many " events." Upon checking a download report it shows 4.9 AHI/hr. It also shows a high mask leak. She was instructed to be sure that her face is clean and dry at night. Also to be sure to wipe down the mask every morning. She was wiping it down before bed at night. She was advised to do this in the mornings to ensure the mask cushion has ample time to fully dry. We will check another download in about 2 weeks.

## 2022-02-27 ENCOUNTER — Other Ambulatory Visit (HOSPITAL_BASED_OUTPATIENT_CLINIC_OR_DEPARTMENT_OTHER): Payer: Self-pay

## 2022-03-24 ENCOUNTER — Other Ambulatory Visit (HOSPITAL_BASED_OUTPATIENT_CLINIC_OR_DEPARTMENT_OTHER): Payer: Self-pay

## 2022-03-24 MED ORDER — OZEMPIC (2 MG/DOSE) 8 MG/3ML ~~LOC~~ SOPN
2.0000 mg | PEN_INJECTOR | SUBCUTANEOUS | 0 refills | Status: DC
  Filled 2022-03-24: qty 3, 28d supply, fill #0

## 2022-03-25 ENCOUNTER — Other Ambulatory Visit (HOSPITAL_BASED_OUTPATIENT_CLINIC_OR_DEPARTMENT_OTHER): Payer: Self-pay

## 2022-04-10 ENCOUNTER — Telehealth: Payer: Self-pay | Admitting: *Deleted

## 2022-04-10 NOTE — Telephone Encounter (Signed)
Patient called in to say that she is continuing to have mask leaking. I recommended that she contact the RT at [facility] for a mask refit. Patient agrees with this plan and will contact [facility].

## 2022-04-20 ENCOUNTER — Other Ambulatory Visit (HOSPITAL_BASED_OUTPATIENT_CLINIC_OR_DEPARTMENT_OTHER): Payer: Self-pay

## 2022-04-20 MED ORDER — OZEMPIC (2 MG/DOSE) 8 MG/3ML ~~LOC~~ SOPN
PEN_INJECTOR | SUBCUTANEOUS | 0 refills | Status: AC
  Filled 2022-04-20: qty 3, 28d supply, fill #0

## 2022-05-07 IMAGING — DX DG HAND COMPLETE 3+V*L*
3 series · 3 of 3 positions shown · non-contrast
Comparison: None.

CLINICAL DATA: Dog bite.

EXAM:
LEFT HAND - COMPLETE 3+ VIEW

[hand ap]
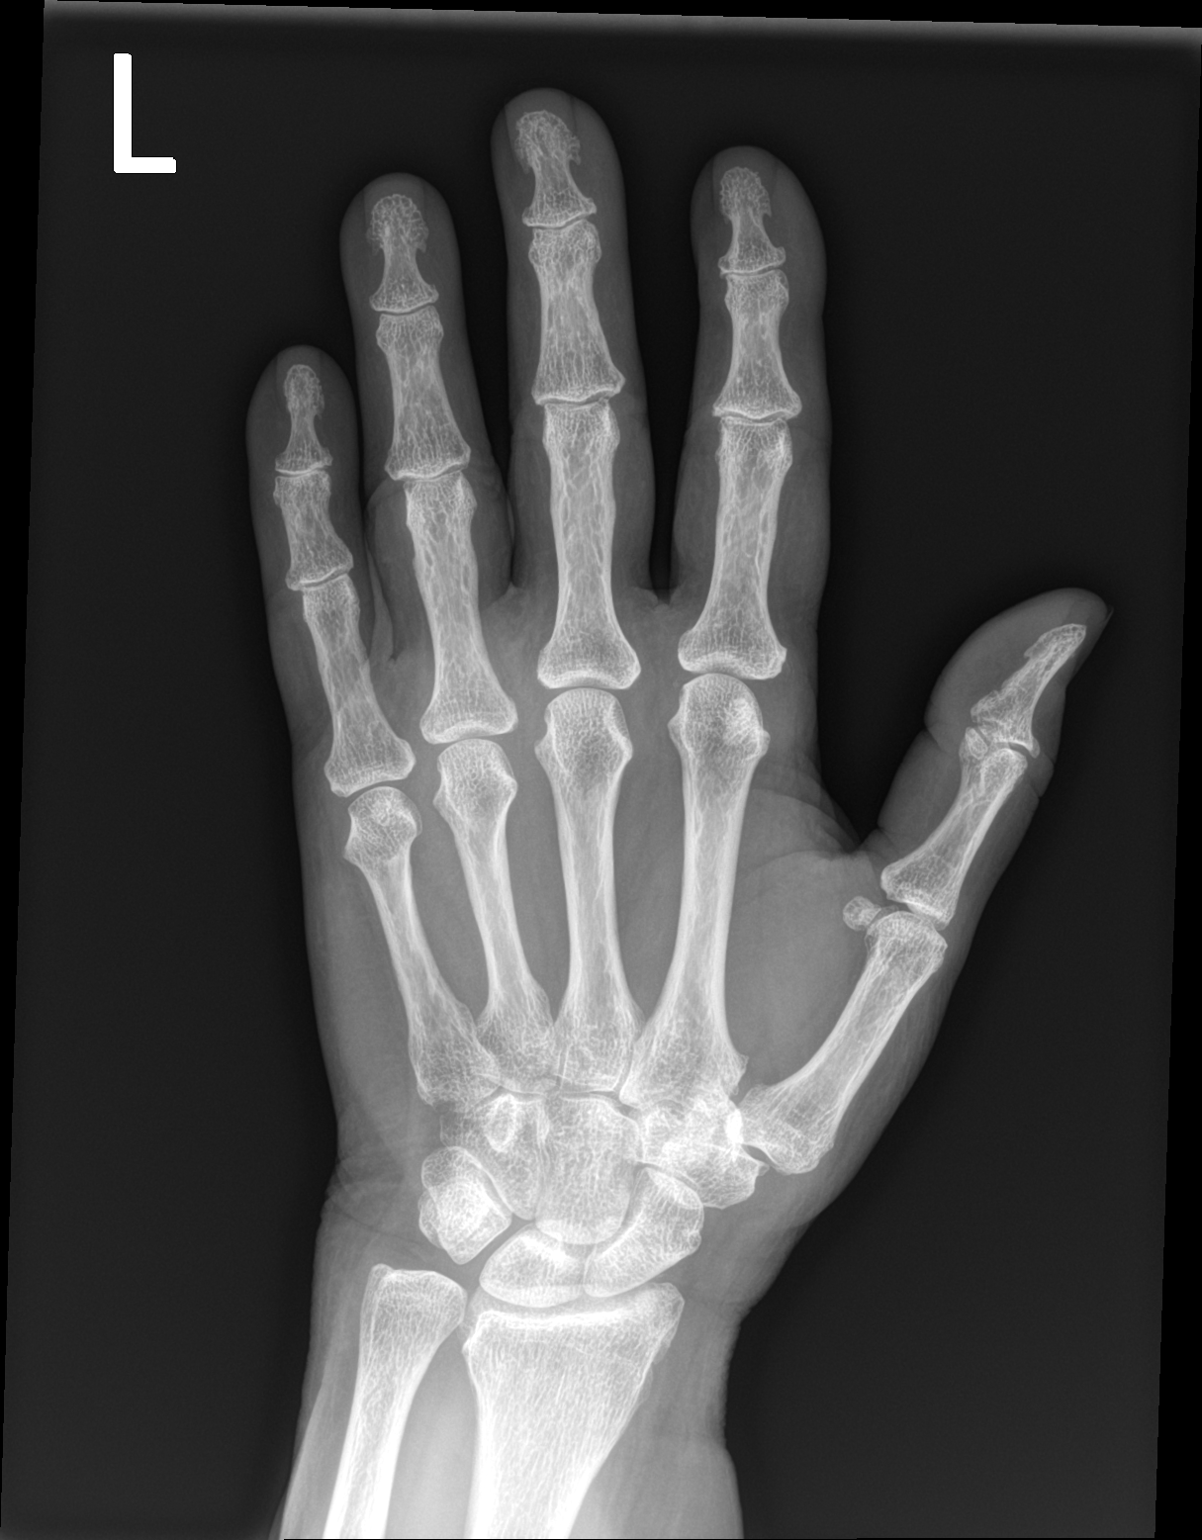

[hand obl]
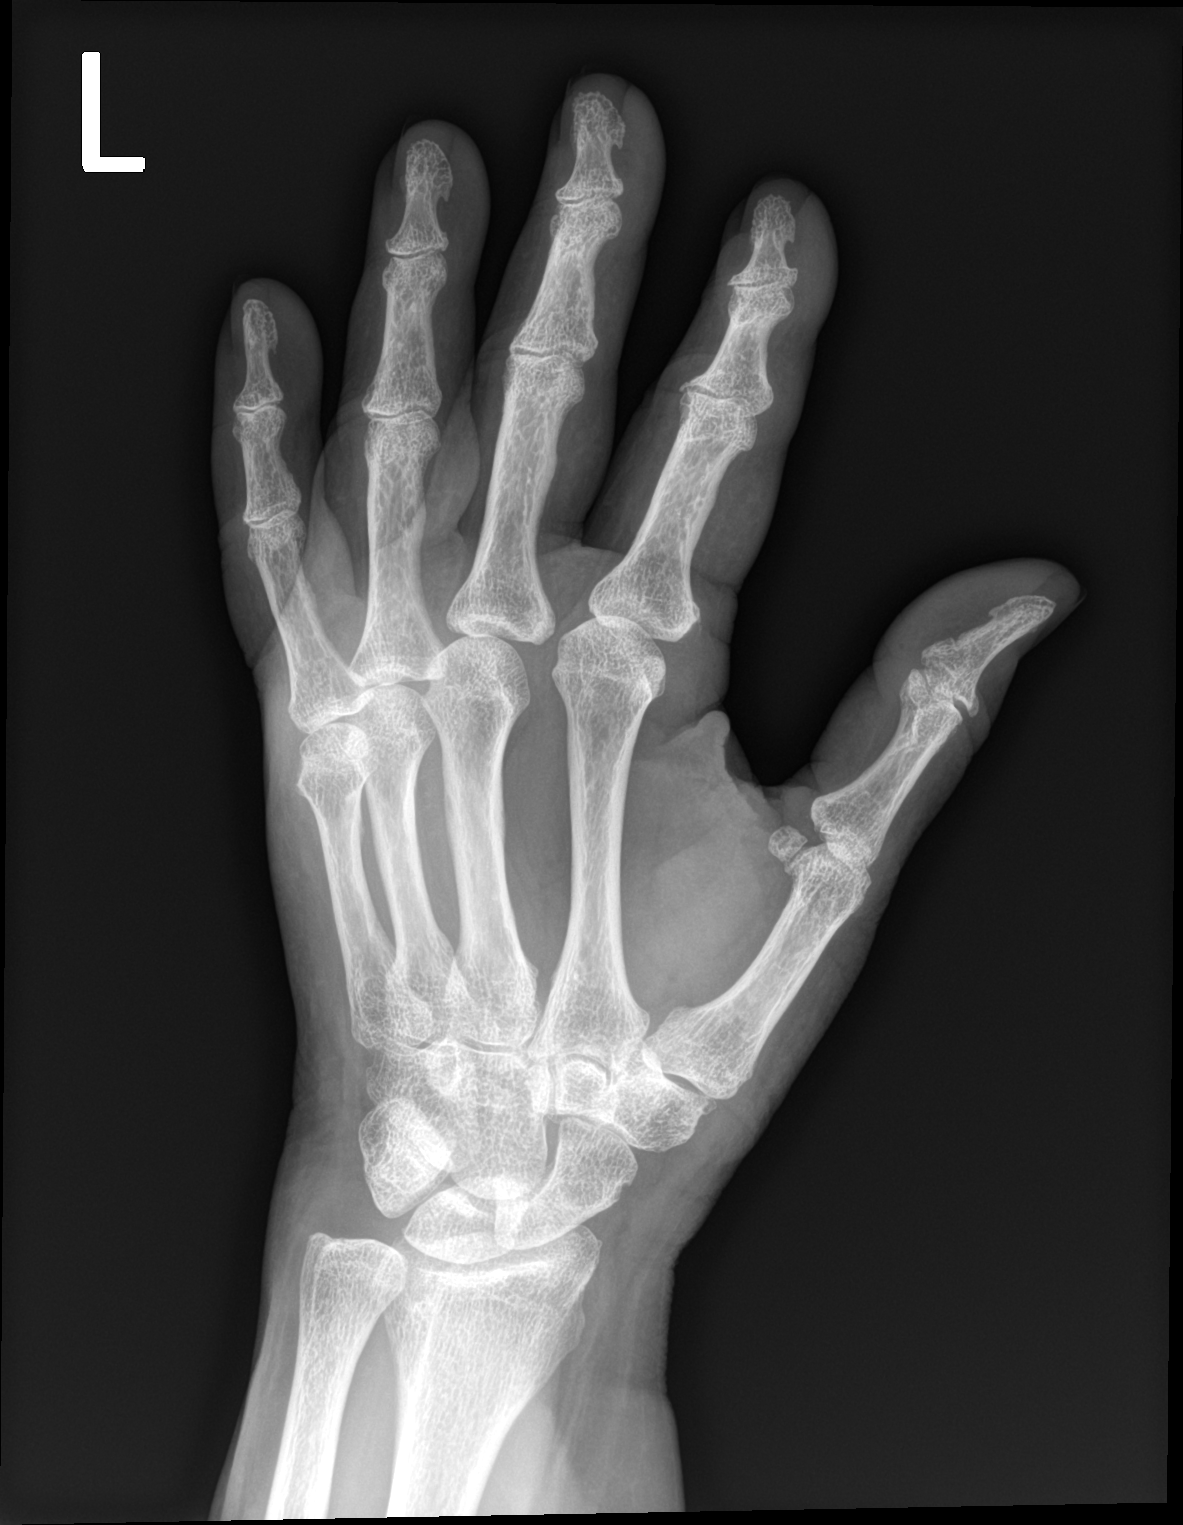

[hand lat]
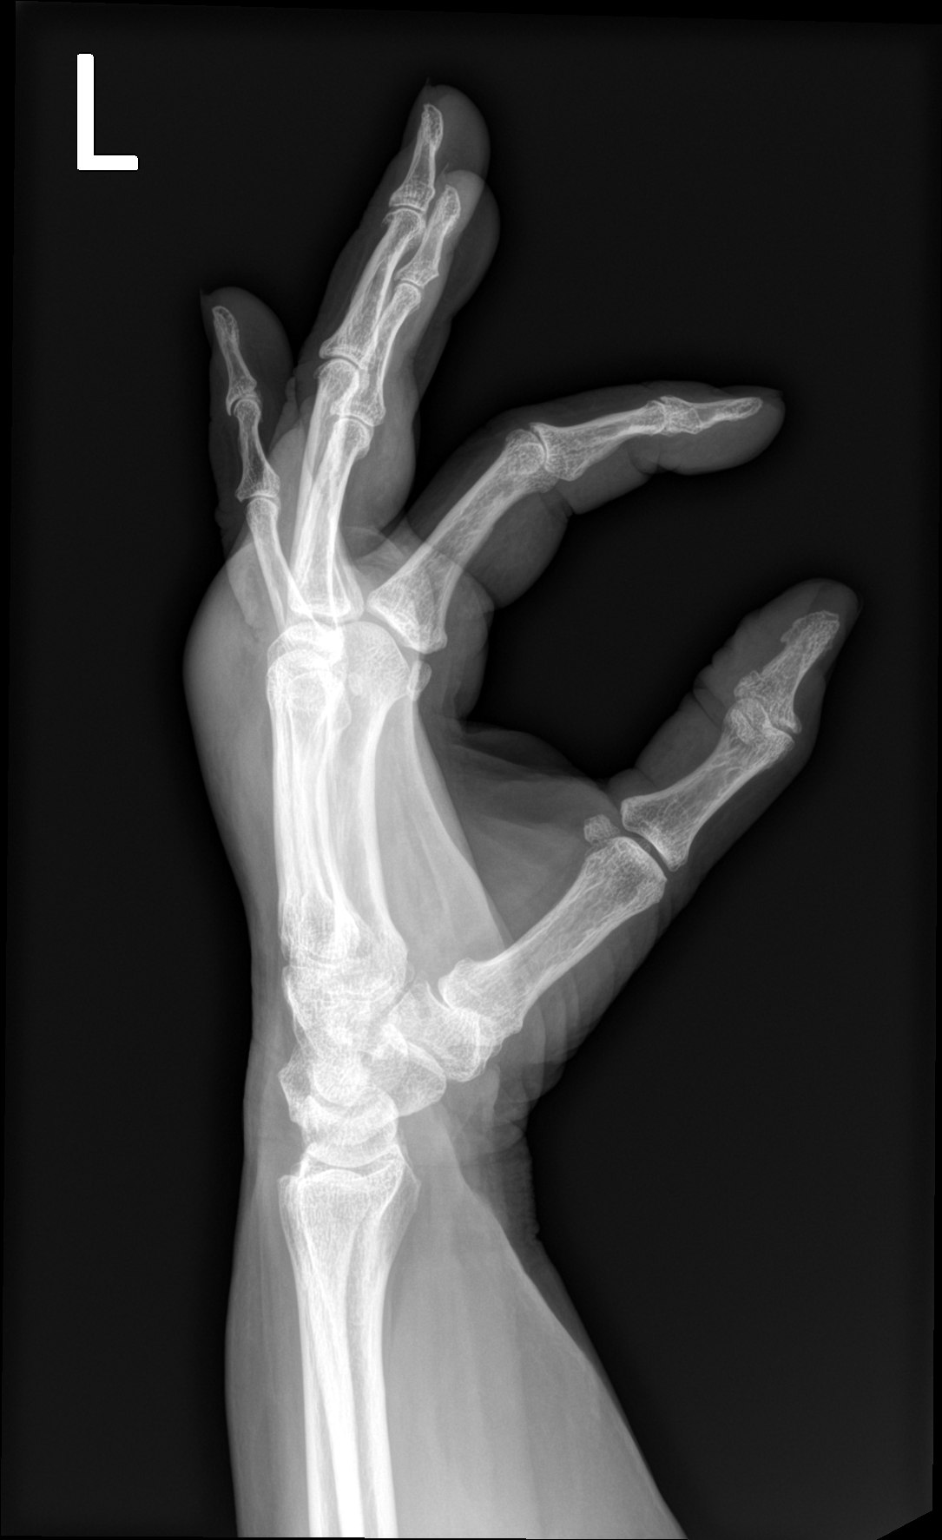

[3 of 3 positions shown; findings below may reference images not displayed]

FINDINGS: There is soft tissue swelling over the dorsum of the hand. There is
no radiopaque foreign body. There is no acute fracture or
dislocation. Joint spaces are maintained.
IMPRESSION: 1. Dorsal hand soft tissue swelling.  No radiopaque foreign body.
2. No acute osseous abnormality.

## 2022-05-27 ENCOUNTER — Other Ambulatory Visit (HOSPITAL_COMMUNITY): Payer: Self-pay

## 2022-06-08 ENCOUNTER — Other Ambulatory Visit (HOSPITAL_BASED_OUTPATIENT_CLINIC_OR_DEPARTMENT_OTHER): Payer: Self-pay

## 2022-06-08 MED ORDER — ATORVASTATIN CALCIUM 10 MG PO TABS
10.0000 mg | ORAL_TABLET | Freq: Every day | ORAL | 0 refills | Status: AC
  Filled 2022-06-08: qty 90, 90d supply, fill #0

## 2022-12-25 ENCOUNTER — Other Ambulatory Visit (HOSPITAL_BASED_OUTPATIENT_CLINIC_OR_DEPARTMENT_OTHER): Payer: Self-pay

## 2022-12-25 MED ORDER — INFLUENZA VAC A&B SURF ANT ADJ 0.5 ML IM SUSY
0.5000 mL | PREFILLED_SYRINGE | Freq: Once | INTRAMUSCULAR | 0 refills | Status: AC
  Filled 2022-12-25: qty 0.5, 1d supply, fill #0

## 2023-01-14 ENCOUNTER — Encounter: Payer: Self-pay | Admitting: Cardiovascular Disease

## 2023-01-14 ENCOUNTER — Ambulatory Visit: Payer: Medicare Other | Attending: Cardiovascular Disease | Admitting: Cardiovascular Disease

## 2023-01-14 VITALS — BP 106/74 | HR 70 | Ht 61.0 in | Wt 165.4 lb

## 2023-01-14 DIAGNOSIS — E782 Mixed hyperlipidemia: Secondary | ICD-10-CM

## 2023-01-14 DIAGNOSIS — I10 Essential (primary) hypertension: Secondary | ICD-10-CM

## 2023-01-14 DIAGNOSIS — R931 Abnormal findings on diagnostic imaging of heart and coronary circulation: Secondary | ICD-10-CM

## 2023-01-14 DIAGNOSIS — G4733 Obstructive sleep apnea (adult) (pediatric): Secondary | ICD-10-CM

## 2023-01-14 DIAGNOSIS — K219 Gastro-esophageal reflux disease without esophagitis: Secondary | ICD-10-CM

## 2023-01-14 NOTE — Progress Notes (Addendum)
Cardiology Office Note    Date:  01/24/2023   ID:  VERSIE GETMAN, DOB June 10, 1950, MRN [id number]  PCP:  Dani Gobble, PA-C  Cardiologist:  Nicki Guadalajara, MD (sleep); Dr. Flora Lipps  11 month F/U  sleep /evaluation  History of Present Illness:  MAKALIE BOGUE is a 72 y.o. female who has a history of who has a history of nonobstructive CAD, obesity, hypertension, hyperlipidemia, prediabetes, and has been diagnosed with obstructive sleep apnea with an in- lab polysomnogram on February 21, 2016.  Her study was read by Dr. Craige Cotta and she had mild overall sleep apnea with an AHI of 13.2/h however during REM sleep sleep apnea was severe at 52.3/h.  Oxygen desaturated to a nadir of 83% and she was noted to have moderate snoring.  Apparently, she has been on CPAP therapy since and has not been followed by Dr. Craige Cotta.  Is followed at [facility] and has seen Mellody Drown, PA apparently on several occasions.  Her DME company had been [facility] and tells me she had recently switched to [facility] for her DME provider.  She was referred to me to establish sleep care with me.  Presently, she goes to bed at around 1030 and wakes up at 7 AM.  Even though she has changed to [facility], her device was still linked to [employer].  [employer] was contacted today to allow her CPAP unit which is an ResMed air sense 10 AutoSet for her to be linked to our office.  I was able to obtain a download from April 26 September 15 2021.  She is meeting compliance standards of 89 of 90 days of use.  Average use is 9 hours and 6 minutes.  Her pressure setting is a range of 7 to 16 cm of water.  Her 95th percentile pressure is 10.2 with maximum average pressure at 11.  She has significant mask leak on a daily basis apparently she had been using an AirFit F30 little high mask.  She sleeps on her side.  States she is in need for an AirSense 10 filter.  Presently, while using CPAP she denies any breakthrough snoring.   Previously she had had morning headaches which resolved.  An Epworth Sleepiness Scale score was calculated in the office today and this endorsed at 8 as shown below arguing against residual daytime sleepiness.   Epworth Sleepiness Scale: Situation   Chance of Dozing/Sleeping (0 = never , 1 = slight chance , 2 = moderate chance , 3 = high chance )   sitting and reading 3   watching TV 3   sitting inactive in a public place 0   being a passenger in a motor vehicle for an hour or more 0   lying down in the afternoon 1   sitting and talking to someone 0   sitting quietly after lunch (no alcohol) 1   while stopped for a few minutes in traffic as the driver 0   Total Score  8    She is unaware of any bruxism, restless legs, hypnopompic or hypnagogic hallucinations or cataplectic events.  She is on hydrochlorothiazide 25 mg daily, losartan 50 mg daily for hypertension and has a prescription for furosemide to take 20 mg as needed.  She is on levothyroxine for hypothyroidism at 75 mg.  She has GERD and is on Protonix.  Most recently she is now on atorvastatin 10 mg for hyperlipidemia.  During her initial sleep evaluation with me I had a lengthy discussion with her regarding sleep apnea and its effects on normal sleep architecture.  I discussed potential adverse cardiovascular consequences if sleep apnea is not treated.  I discussed its implications with blood pressure control, potential for nocturnal arrhythmias, increased risk for atrial fibrillation if untreated and its negative its effects on blood sugar with insulin resistance, increased inflammatory markers, as well as GERD.  With her GERD history, she has been taking pantoprazole.  I discussed sleeping on her left side would be more optimal to reduce reflux.  In addition I discussed potential nocturnal hypoxemia contributing to nocturnal ischemia both cardiac as well as cerebrovascular if atherosclerosis is present.  I also discussed the  pathophysiology associated with increased nocturia in patients with untreated sleep apnea.  She was unaware of the majority of these associations.  She qualified for new CPAP device.  I last saw on February 11, 2022.  She received a new ResMed AirSense 11 AutoSet unit on December 30, 2021 as a replacement machine.  [employer] is now her DME company.  I obtained a download from November 20 through February 10, 2022.  Compliance is excellent with average use at 8 hours and 59 minutes.  She has significant leak on a daily basis with 95th percentile leak at 107.5 L/min with a threshold at 24.0 L/min.  Her 95th percentile pressure is 11.6 with maximum average pressure 13.3.  AHI was increased probably contributed by the significant leak at 5.6/h.  She has been using a ResMed AirFit F30i mask but seems to have leakage around the nasal portion.  Her mask was a medium size.  Presently she feels well with her new machine and noted improvement from her previous CPAP device.  She denies any residual daytime sleepiness or awareness of snoring.    Since I last saw her, she has continued to use CPAP therapy and feels well.  I obtained a download from October 21 through January 12, 2023.  Usage days is 100% and average use 8 hours and 49 minutes.  At her pressure range of 7 to 16 cm, her 95th percentile pressure is 13.5 with maximum average pressure 15.2.  AHI is 3.4.  She is sleeping well.  She denies any residual daytime sleepiness and an Epworth Sleepiness Scale score was calculated in the office today and this endorsed at 3 arguing against residual sleepiness.  She presents for yearly evaluation.  Past Medical History:  Diagnosis Date   Allergy    seasonal   Anxiety    Arthritis    neck, back and legs    Benign essential HTN 03/30/2014   Borderline diabetes    diet controlled   Cataract    bilateral removed   GERD (gastroesophageal reflux disease)    Glaucoma    Hyperlipidemia    Hypertension    OSA  (obstructive sleep apnea) 02/28/2016   PUD (peptic ulcer disease) 03/30/2014   Sleep apnea    wear cpap   Thyroid disease     Past Surgical History:  Procedure Laterality Date   BREAST BIOPSY Right 1986   BREAST EXCISIONAL BIOPSY Right    benign   CATARACT EXTRACTION Right 2014   CATARACT EXTRACTION Left 02/28/2014   CESAREAN SECTION  1971, 1975, 1978   x3   COLONOSCOPY     DILATION AND CURETTAGE OF UTERUS     X3   HEMORRHOID BANDING     HYSTEROSCOPY WITH D & C  N/A 05/10/2012   Procedure: DILATATION AND CURETTAGE /HYSTEROSCOPY;  Surgeon: Miguel Aschoff, MD;  Location: [facility];  Service: Gynecology;  Laterality: N/A;   POLYPECTOMY      Current Medications: Outpatient Medications Prior to Visit  Medication Sig Dispense Refill   albuterol (VENTOLIN HFA) 108 (90 Base) MCG/ACT inhaler SMARTSIG:2 Puff(s) By Mouth Every 4-6 Hours PRN     atorvastatin (LIPITOR) 10 MG tablet Take 1 tablet (10 mg total) by mouth daily. 90 tablet 0   Calcium Carbonate-Vitamin D (CALTRATE 600+D PO) Take 1 tablet by mouth 2 (two) times daily.      cetirizine (ZYRTEC) 10 MG tablet Take 10 mg by mouth daily. Pt takes generic form of Zyrtec     Cholecalciferol (VITAMIN D3) 1000 UNITS CAPS Take 2,000 mg by mouth daily.     Cyanocobalamin (VITAMIN B12 PO) Take by mouth.     FLUoxetine (PROZAC) 10 MG capsule Take 10 mg by mouth daily.     hydrochlorothiazide (HYDRODIURIL) 25 MG tablet Take 25 mg by mouth daily.     hydrocortisone (ANUSOL-HC) 2.5 % rectal cream Place 1 application rectally 2 (two) times daily. (Patient taking differently: Place 1 application  rectally as needed.) 30 g 1   lansoprazole (PREVACID) 30 MG capsule Take 1 capsule (30 mg total) by mouth daily. 30 capsule 3   levothyroxine (SYNTHROID) 75 MCG tablet Take 75 mcg by mouth daily.     losartan (COZAAR) 25 MG tablet Take 50 mg by mouth daily.      metoprolol succinate (TOPROL-XL) 25 MG 24 hr tablet Take 1 tablet by mouth once daily     Multiple Vitamin  (MULITIVITAMIN WITH MINERALS) TABS Take 1 tablet by mouth every evening.      nystatin cream (MYCOSTATIN) nystatin 100,000 unit/gram topical cream  APPLY CREAM TOPICALLY TO AFFECTED AREA TWICE DAILY     Omega-3 Fatty Acids (FISH OIL) 1000 MG CAPS Take 1 capsule by mouth 3 (three) times daily.      pantoprazole (PROTONIX) 40 MG tablet Take 1 tablet by mouth once daily 90 tablet 0   RSV vaccine recomb adjuvanted (AREXVY) 120 MCG/0.5ML injection Inject into the muscle. 0.5 mL 0   Semaglutide, 2 MG/DOSE, (OZEMPIC, 2 MG/DOSE,) 8 MG/3ML SOPN Inject 2 mg into the skin once a week. 3 mL 0   timolol (TIMOPTIC) 0.5 % ophthalmic solution Place 1 drop into both eyes daily.     furosemide (LASIX) 20 MG tablet Take 1 tablet (20 mg total) by mouth daily as needed. 90 tablet 3   atorvastatin (LIPITOR) 10 MG tablet Take 10 mg by mouth daily.     gabapentin (NEURONTIN) 100 MG capsule Take 2 capsules (200 mg total) by mouth 2 (two) times daily. 180 capsule 3   No facility-administered medications prior to visit.     Allergies:   Morphine and codeine, Codeine, Oxycodone, Tramadol, and Tape   Social History   Socioeconomic History   Marital status: Widowed    Spouse name: Not on file   Number of children: 3   Years of education: Not on file   Highest education level: Not on file  Occupational History   Occupation: caregiver  Tobacco Use   Smoking status: Never   Smokeless tobacco: Never  Vaping Use   Vaping status: Never Used  Substance and Sexual Activity   Alcohol use: No    Comment: social/rare   Drug use: No   Sexual activity: Not on file  Other Topics Concern  Not on file  Social History Narrative   Not on file   Social Determinants of Health   Financial Resource Strain: Low Risk  (07/05/2022)   Received from [facility], [facility]   Overall Financial Resource Strain (CARDIA)    Difficulty of Paying Living Expenses: Not hard at all  Food Insecurity: No Food Insecurity  (07/05/2022)   Received from [facility], [facility]   Hunger Vital Sign    Worried About Running Out of Food in the Last Year: Never true    Ran Out of Food in the Last Year: Never true  Transportation Needs: No Transportation Needs (07/05/2022)   Received from [facility], [facility]   PRAPARE - Transportation    Lack of Transportation (Medical): No    Lack of Transportation (Non-Medical): No  Physical Activity: Unknown (07/05/2022)   Received from [facility], [facility]   Exercise Vital Sign    Days of Exercise per Week: 0 days    Minutes of Exercise per Session: Not on file  Stress: No Stress Concern Present (07/05/2022)   Received from [facility], [facility] - Occupational Stress Questionnaire    Feeling of Stress : Not at all  Social Connections: Socially Integrated (07/05/2022)   Received from [facility], [facility]   Social Network    How would you rate your social network (family, work, friends)?: Good participation with social networks    Social history is notable that she was born in [place].  She is widowed and has 3 children and 3 great-grandchildren.  Family History:  The patient's family history includes Asthma in her sister; Brain cancer in her sister; Breast cancer in her sister and sister; COPD in her sister; Colon polyps in her sister; Congestive Heart Failure in her sister; Emphysema in her sister; Heart Problems in her mother; Heart disease in her brother and sister; Pancreatic cancer in her sister.  Father died at age 30 of questionable cause.  Mother is deceased.  Her parents had 6 girls and 2 boys other than herself.  All siblings are deceased.  ROS General: Negative; No fevers, chills, or night sweats;  HEENT: Negative; No changes in vision or hearing, sinus congestion, difficulty swallowing Pulmonary: Negative; No cough, wheezing, shortness of breath, hemoptysis Cardiovascular: Positive  for hypertension, hyperlipidemia GI: GERD GU: Negative; No dysuria, hematuria, or difficulty voiding Musculoskeletal: Negative; no myalgias, joint pain, or weakness Hematologic/Oncology: Negative; no easy bruising, bleeding Endocrine: Prediabetes, hypothyroidism Neuro: Negative; no changes in balance, headaches Skin: Negative; No rashes or skin lesions Psychiatric: Negative; No behavioral problems, depression Sleep: See HPI Other comprehensive 14 point system review is negative.   PHYSICAL EXAM:   VS:  BP 106/74   Pulse 70   Ht 5\' 1"  (1.549 m)   Wt 165 lb 6.4 oz (75 kg)   SpO2 95%   BMI 31.25 kg/m     Repeat blood pressure by me was 118/72  Wt Readings from Last 3 Encounters:  01/14/23 165 lb 6.4 oz (75 kg)  02/11/22 165 lb 3.2 oz (74.9 kg)  09/17/21 177 lb (80.3 kg)    General: Alert, oriented, no distress.  Skin: normal turgor, no rashes, warm and dry HEENT: Normocephalic, atraumatic. Pupils equal round and reactive to light; sclera anicteric; extraocular muscles intact; Nose without nasal septal hypertrophy Mouth/Parynx: Small oral aperture;high arched palate; Mallinpatti scale 3/4 Neck: No JVD, no carotid bruits; normal carotid upstroke Lungs: clear to ausculatation and  percussion; no wheezing or rales Chest wall: without tenderness to palpitation Heart: PMI not displaced, RRR, s1 s2 normal, 1/6 systolic murmur, no diastolic murmur, no rubs, gallops, thrills, or heaves Abdomen: soft, nontender; no hepatosplenomehaly, BS+; abdominal aorta nontender and not dilated by palpation. Back: no CVA tenderness Pulses 2+ Musculoskeletal: full range of motion, normal strength, no joint deformities Extremities: no clubbing cyanosis or edema, Homan's sign negative  Neurologic: grossly nonfocal; Cranial nerves grossly wnl Psychologic: Normal mood and affect    Studies/Labs Reviewed:    EKG Interpretation Date/Time:  Thursday January 14 2023 10:59:49 EST Ventricular Rate:   70 PR Interval:  180 QRS Duration:  76 QT Interval:  394 QTC Calculation: 425 R Axis:   49  Text Interpretation: Normal sinus rhythm Low voltage QRS When compared with ECG of 02-Jul-2017 17:06, PREVIOUS ECG IS PRESENT Confirmed by Nicki Guadalajara ([id number]) on 01/14/2023 11:46:57 AM    February 11, 2022 ECG (independently read by me): NSR at 69  September 17, 2021 ECG (independently read by me): NSR at 72, low voltage, no ectopy  Recent Labs:    Latest Ref Rng & Units 08/19/2020    2:02 PM 12/11/2019    3:42 PM 07/02/2017    6:05 PM  BMP  Glucose 70 - 99 mg/dL 191  90  478   BUN 6 - 23 mg/dL 14  11  13    Creatinine 0.40 - 1.20 mg/dL 2.95  6.21  3.08   BUN/Creat Ratio 12 - 28  12    Sodium 135 - 145 mEq/L 133  143  142   Potassium 3.5 - 5.1 mEq/L 4.0  4.3  4.0   Chloride 96 - 112 mEq/L 97  105  107   CO2 19 - 32 mEq/L 28  24  24    Calcium 8.4 - 10.5 mg/dL 9.5  9.7  9.5         Latest Ref Rng & Units 08/19/2020    2:02 PM 07/02/2017    6:05 PM  Hepatic Function  Total Protein 6.0 - 8.3 g/dL 7.7  8.1   Albumin 3.5 - 5.2 g/dL 4.3  4.6   AST 0 - 37 U/L 28  29   ALT 0 - 35 U/L 27  21   Alk Phosphatase 39 - 117 U/L 58  63   Total Bilirubin 0.2 - 1.2 mg/dL 0.4  0.6        Latest Ref Rng & Units 08/19/2020    2:02 PM 07/02/2017    6:05 PM 01/01/2014    9:10 AM  CBC  WBC 4.0 - 10.5 K/uL 7.3  8.2  5.6   Hemoglobin 12.0 - 15.0 g/dL 65.7  84.6  96.2   Hematocrit 36.0 - 46.0 % 36.7  41.6  41.4   Platelets 150.0 - 400.0 K/uL 262.0  302  282    Lab Results  Component Value Date   MCV 89.5 08/19/2020   MCV 95.2 07/02/2017   MCV 92.6 01/01/2014   No results found for: "TSH" No results found for: "HGBA1C"   BNP    Component Value Date/Time   BNP 53.7 12/11/2019 1542    ProBNP    Component Value Date/Time   PROBNP 228.0 (H) 08/19/2020 1402     Lipid Panel  No results found for: "CHOL", "TRIG", "HDL", "CHOLHDL", "VLDL", "LDLCALC", "LDLDIRECT", "LABVLDL"   RADIOLOGY: No  results found.   Additional studies/ records that were reviewed today include:     Patient Name:  Carolan, Life Date: 02/21/2016 Gender: Female D.O.B: January 14, 1951 Age (years): 28 Referring Provider: Coralyn Helling MD, ABSM Height (inches): 60 Interpreting Physician: Coralyn Helling MD, ABSM Weight (lbs): 154 RPSGT: Celene Kras BMI: 30 MRN: [id number] Neck Size: 15.00   CLINICAL INFORMATION Sleep Study Type: NPSG   Indication for sleep study: Morning Headaches, Snoring   Epworth Sleepiness Score: 8   SLEEP STUDY TECHNIQUE As per the AASM Manual for the Scoring of Sleep and Associated Events v2.3 (April 2016) with a hypopnea requiring 4% desaturations.   The channels recorded and monitored were frontal, central and occipital EEG, electrooculogram (EOG), submentalis EMG (chin), nasal and oral airflow, thoracic and abdominal wall motion, anterior tibialis EMG, snore microphone, electrocardiogram, and pulse oximetry.   MEDICATIONS Medications self-administered by patient taken the night of the study : N/A   SLEEP ARCHITECTURE The study was initiated at 10:59:49 PM and ended at 5:00:27 AM.   Sleep onset time was 5.4 minutes and the sleep efficiency was 78.1%. The total sleep time was 281.7 minutes.   Stage REM latency was 265.0 minutes.   The patient spent 3.90% of the night in stage N1 sleep, 89.17% in stage N2 sleep, 0.00% in stage N3 and 6.92% in REM.   Alpha intrusion was absent.   Supine sleep was 60.52%.   RESPIRATORY PARAMETERS The overall apnea/hypopnea index (AHI) was 13.2 per hour. There were 0 total apneas, including 0 obstructive, 0 central and 0 mixed apneas. There were 62 hypopneas and 3 RERAs.   The AHI during Stage REM sleep was 52.3 per hour.   AHI while supine was 21.1 per hour.   The mean oxygen saturation was 92.60%. The minimum SpO2 during sleep was 83.00%.   Moderate snoring was noted during this study.   CARDIAC DATA The 2 lead EKG  demonstrated sinus rhythm. The mean heart rate was 63.54 beats per minute. Other EKG findings include: None.   LEG MOVEMENT DATA The total PLMS were 3 with a resulting PLMS index of 0.64. Associated arousal with leg movement index was 0.9 .   IMPRESSIONS - Mild obstructive sleep apnea occurred during this study (AHI = 13.2/h). - Mild oxygen desaturation was noted during this study (Min O2 = 83.00%).   DIAGNOSIS - Obstructive Sleep Apnea (327.23 [G47.33 ICD-10]).   RECOMMENDATIONS - Additional therapies include weight loss, CPAP, oral appliance, and surgical assessment.   [Electronically signed] 02/28/2016 02:06 PM   Coralyn Helling MD, ABSM Diplomate, American Board of Sleep Medicine     ASSESSMENT:    1. OSA (obstructive sleep apnea)   2. Primary hypertension   3. Mixed hyperlipidemia   4. Agatston coronary artery calcium score less than 100   5. Gastroesophageal reflux disease without esophagitis      PLAN:  Ms. Phiona Olliver is a 72 year old female history of nonobstructive CAD, obesity, hypertension, hyperlipidemia, hypothyroidism, and prediabetes.  In 2017 she was evaluated by Dr. Craige Cotta for obstructive sleep apnea and a diagnostic polysomnogram performed on February 20, 2026 confirmed mild overall sleep apnea with an AHI of 13.2/h; however during REM sleep her sleep apnea was severe with an AHI of 52.3.  Oxygen desaturated to a nadir of 83%.  Apparently, CPAP therapy was initiated and subsequently, she has been followed at [facility] by Hali Marry, PA.  She sees Billee Cashing, PA-C for primary care.  Previously, she had switched her DME company from [employer] to [facility].  She admits to compliance.  I was able to obtain  a download of her device and had her device linked to our office for continued follow-up.  When I saw her for my initial sleep evaluation on September 17, 2021, she was compliant.  She qualified for a new machine.  She was having issues with her old mask.   During my initial evaluation I had an extensive discussion with her regarding sleep apnea and potential adverse cardiovascular consequences if left untreated.  She received a new ResMed AirSense 11 AutoSet unit on December 30, 2021.  She was compliant on initial assessment.  Presently, she continues to use CPAP with 100% compliance and is averaging 8 hours and 49 minutes per night.  Her CPAP pressure set at a range of 7 to 16 cm of water and her 95th percentile pressure is 13.5 with maximum average pressure 15.2.  Current AHI is 3.4.  Apparently she has a ramp start pressure that had been set at's for.  I am changing his ramp initiating pressure to 7 and will decrease her ramp time down to 15 minutes.  I will also change her pressure settings to a range of 10 to 16 cm of water.  This should further improve her AHI.  Her DME is now [facility].  In the office today I gave her a sample AirTouch F20 mask.  I will see her on an as-needed basis.  I discussed plans for future retirement and if she needs to be seen subsequently, she will need to be transitioned to another provider or return back to Dr. Craige Cotta who is now with [facility].  Medication Adjustments/Labs and Tests Ordered: Current medicines are reviewed at length with the patient today.  Concerns regarding medicines are outlined above.  Medication changes, Labs and Tests ordered today are listed in the Patient Instructions below.   Patient Instructions  Medication Instructions:  No medication changes *If you need a refill on your cardiac medications before your next appointment, please call your pharmacy*   Lab Work: No labs were order during today's visit.  If you have labs (blood work) drawn today and your tests are completely normal, you will receive your results only by: MyChart Message (if you have MyChart) OR A paper copy in the mail If you have any lab test that is abnormal or we need to change your treatment, we will call you to review  the results.   Testing/Procedures: No procedures ordered today.    Follow-Up: At [facility], you and your health needs are our priority.  As part of our continuing mission to provide you with exceptional heart care, we have created designated Provider Care Teams.  These Care Teams include your primary Cardiologist (physician) and Advanced Practice Providers (APPs -  Physician Assistants and Nurse Practitioners) who all work together to provide you with the care you need, when you need it.  We recommend signing up for the patient portal called "MyChart".  Sign up information is provided on this After Visit Summary.  MyChart is used to connect with patients for Virtual Visits (Telemedicine).  Patients are able to view lab/test results, encounter notes, upcoming appointments, etc.  Non-urgent messages can be sent to your provider as well.   To learn more about what you can do with MyChart, go to [email or web address].    Your next appointment:    As needed for sleep concerns  Provider:   Reatha Harps, MD     Other Instructions If you have any questions or concerns regarding your c-pap, bi-pap  or sleep accessories, please contact Brandie Rorie at [phone].      Signed, Nicki Guadalajara, MD,FACC, ABSM Diplomate, American Board of Sleep Medicine  01/24/2023 1:12 PM    [facility] [address], [place], [place]  [address] Phone: [phone]

## 2023-01-14 NOTE — Patient Instructions (Signed)
Medication Instructions:  No medication changes *If you need a refill on your cardiac medications before your next appointment, please call your pharmacy*   Lab Work: No labs were order during today's visit.  If you have labs (blood work) drawn today and your tests are completely normal, you will receive your results only by: MyChart Message (if you have MyChart) OR A paper copy in the mail If you have any lab test that is abnormal or we need to change your treatment, we will call you to review the results.   Testing/Procedures: No procedures ordered today.    Follow-Up: At [facility], you and your health needs are our priority.  As part of our continuing mission to provide you with exceptional heart care, we have created designated Provider Care Teams.  These Care Teams include your primary Cardiologist (physician) and Advanced Practice Providers (APPs -  Physician Assistants and Nurse Practitioners) who all work together to provide you with the care you need, when you need it.  We recommend signing up for the patient portal called "MyChart".  Sign up information is provided on this After Visit Summary.  MyChart is used to connect with patients for Virtual Visits (Telemedicine).  Patients are able to view lab/test results, encounter notes, upcoming appointments, etc.  Non-urgent messages can be sent to your provider as well.   To learn more about what you can do with MyChart, go to [email or web address].    Your next appointment:    As needed for sleep concerns  Provider:   Reatha Harps, MD     Other Instructions If you have any questions or concerns regarding your c-pap, bi-pap or sleep accessories, please contact Brandie Rorie at [phone].

## 2023-01-14 NOTE — Progress Notes (Signed)
Per Dr. Tresa Endo:  Change Ramp time from 20 to 15. Change Start Pressure from 4.0 to 7.0. Change min Pressure from 7.0 to 10. New cushion, filters, heated tubing and water chamber.   Sent to [facility] today.

## 2023-01-24 ENCOUNTER — Encounter: Payer: Self-pay | Admitting: Cardiovascular Disease

## 2023-01-24 NOTE — Addendum Note (Signed)
Addended by: Nicki Guadalajara A on: 01/24/2023 01:13 PM   Modules accepted: Level of Service

## 2024-01-27 ENCOUNTER — Telehealth: Payer: Self-pay

## 2024-01-27 NOTE — Telephone Encounter (Signed)
 [name]

## 2024-02-02 ENCOUNTER — Ambulatory Visit: Admitting: Cardiology

## 2024-03-10 ENCOUNTER — Ambulatory Visit: Attending: Cardiology | Admitting: Cardiology

## 2024-03-10 VITALS — BP 112/64 | HR 71 | Ht 61.0 in | Wt 171.0 lb

## 2024-03-10 DIAGNOSIS — G4733 Obstructive sleep apnea (adult) (pediatric): Secondary | ICD-10-CM | POA: Diagnosis not present

## 2024-03-10 DIAGNOSIS — I10 Essential (primary) hypertension: Secondary | ICD-10-CM | POA: Diagnosis not present

## 2024-03-10 NOTE — Progress Notes (Signed)
 "     SLEEP OFFICE VISIT NOTE:    Date:  03/10/2024   ID:  Beth Sawyer, DOB 10-24-1950, MRN [id number]   PCP:  Jacques Camie Pepper, PA-C  Cardiologist:  Darryle ONEIDA Decent, MD     Chief Complaint  Patient presents with   Sleep Apnea   Hypertension    History of Present Illness:    Beth Sawyer is a 74 y.o. female with a hx of nonobstructive CAD, hypertension, hyperlipidemia, prediabetes and obstructive sleep apnea.  She initially had a sleep study done in 2017 which showed overall mild obstructive sleep apnea with an AHI of 13.2/h but severe during REM sleep with REM AHI 52.3/h.  O2 saturation nadir was 83% with moderate snoring.  She had originally been followed by Dr. Shellia for her CPAP and then was following up with Camelia Ee, PA with [facility].  Dr. Burnard started seeing her and she was on ResMed AirSense 10 AutoSet at 7 to 16 cm H2O.  She got a new ResMed AirSense 11 AutoSet unit December 30, 2021 set at 7 to 16 cm H2O..  When he saw her 01/14/2023 her ramp initiated pressure was changed to 7 cm H2O and ramp time down to 15 cm H2O.  Her pressure setting was also changed to an auto CPAP 10 to 16 cm H2O.  She is now here to establish care with me upon Dr. Joesphine retirement.  She is doing well with her PAP device.  She tolerates the full face mask and feels the pressure is adequate.  She feels rested in the am and has no significant daytime sleepiness but does nap some during the day on occasion.[name]  She wakes up with a pretty dry mouth. She denies any significant  nasal dryness or nasal congestion.  She does not think that she snores. An Epworth Sleepiness Scale score was calculated the office today and this endorsed at 0 arguing against residual daytime sleepiness. Patient denies any episodes of bruxism, restless legs, No hypnogognic hallucinations or cataplectic events.    Past Medical History:  Diagnosis Date   Allergy    seasonal   Anxiety    Arthritis    neck, back and  legs    Benign essential HTN 03/30/2014   Borderline diabetes    diet controlled   Cataract    bilateral removed   GERD (gastroesophageal reflux disease)    Glaucoma    Hyperlipidemia    Hypertension    OSA (obstructive sleep apnea) 02/28/2016   PUD (peptic ulcer disease) 03/30/2014   Sleep apnea    wear cpap   Thyroid disease     Past Surgical History:  Procedure Laterality Date   BREAST BIOPSY Right 1986   BREAST EXCISIONAL BIOPSY Right    benign   CATARACT EXTRACTION Right 2014   CATARACT EXTRACTION Left 02/28/2014   CESAREAN SECTION  1971, 1975, 1978   x3   COLONOSCOPY     DILATION AND CURETTAGE OF UTERUS     X3   HEMORRHOID BANDING     HYSTEROSCOPY WITH D & C N/A 05/10/2012   Procedure: DILATATION AND CURETTAGE /HYSTEROSCOPY;  Surgeon: Peggye Gull, MD;  Location: [facility];  Service: Gynecology;  Laterality: N/A;   POLYPECTOMY      Current Medications: Active Medications[1]   Allergies:   Morphine and codeine, Codeine, Oxycodone, Tramadol , and Tape   Social History   Socioeconomic History   Marital status: Widowed    Spouse name: Not  on file   Number of children: 3   Years of education: Not on file   Highest education level: Not on file  Occupational History   Occupation: caregiver  Tobacco Use   Smoking status: Never   Smokeless tobacco: Never  Vaping Use   Vaping status: Never Used  Substance and Sexual Activity   Alcohol use: No    Comment: social/rare   Drug use: No   Sexual activity: Not on file  Other Topics Concern   Not on file  Social History Narrative   Not on file   Social Drivers of Health   Tobacco Use: Low Risk (02/14/2024)   Received from [facility]   Patient History    Smoking Tobacco Use: Never    Smokeless Tobacco Use: Never    Passive Exposure: Never  Financial Resource Strain: Low Risk (07/10/2023)   Received from [facility]   Overall Financial Resource Strain (CARDIA)    Difficulty of Paying Living Expenses: Not very hard   Food Insecurity: No Food Insecurity (07/10/2023)   Received from [facility]   Epic    Within the past 12 months, you worried that your food would run out before you got the money to buy more.: Never true    Within the past 12 months, the food you bought just didn't last and you didn't have money to get more.: Never true  Transportation Needs: No Transportation Needs (07/10/2023)   Received from [facility] - Transportation    Lack of Transportation (Medical): No    Lack of Transportation (Non-Medical): No  Physical Activity: Insufficiently Active (07/10/2023)   Received from [facility]   Exercise Vital Sign    On average, how many days per week do you engage in moderate to strenuous exercise (like a brisk walk)?: 3 days    On average, how many minutes do you engage in exercise at this level?: 20 min  Stress: No Stress Concern Present (07/10/2023)   Received from [facility] - Occupational Stress Questionnaire    Feeling of Stress : Not at all  Social Connections: Socially Integrated (07/10/2023)   Received from [facility]   Social Network    How would you rate your social network (family, work, friends)?: Good participation with social networks  Depression (PHQ2-9): Not on file  Alcohol Screen: Not on file  Housing: Low Risk (07/10/2023)   Received from [facility]    In the last 12 months, was there a time when you were not able to pay the mortgage or rent on time?: No    In the past 12 months, how many times have you moved where you were living?: 0    At any time in the past 12 months, were you homeless or living in a shelter (including now)?: No  Utilities: Not At Risk (07/10/2023)   Received from [facility]    Threatened with loss of utilities: No  Health Literacy: Not on file     Family History: The patient's family history includes Asthma in her sister; Brain cancer in her sister;  Breast cancer in her sister and sister; COPD in her sister; Colon polyps in her sister; Congestive Heart Failure in her sister; Emphysema in her sister; Heart Problems in her mother; Heart disease in her brother and sister; Pancreatic cancer in her sister. There is no history of Colon cancer, Esophageal cancer, Rectal cancer,  or Stomach cancer.  ROS:   Please see the history of present illness.    ROS  All other systems reviewed and negative.   EKGs/Labs/Other Studies Reviewed:    The following studies were reviewed today: Home sleep study, PAP compliance download  Physical Exam:    VS:  There were no vitals taken for this visit.    Wt Readings from Last 3 Encounters:  01/14/23 165 lb 6.4 oz (75 kg)  02/11/22 165 lb 3.2 oz (74.9 kg)  09/17/21 177 lb (80.3 kg)       ASSESSMENT:    1. OSA (obstructive sleep apnea)   2. Benign essential HTN    PLAN:    In order of problems listed above:  OSA - The patient is tolerating PAP therapy well without any problems. The PAP download performed by his DME was personally reviewed and interpreted by me today and showed an AHI of 7.7 /hr on auto CPAP from 7-16 cm H2O with 100% compliance in using more than 4 hours nightly.  Her 95th percentile pressure is 15.3 the patient has been using and benefiting from PAP use and will continue to benefit from therapy.  - She has a fairly significant mask leak and likely contributing to her increased AHI -I will get her in to see the DME for a mask fitting and also send in order for cushions every 4 weeks -Will get a download in 4 weeks -I encouraged her to adjust her humidity to help with mouth dryness.  Hypertension - BP controlled on exam today - Continue HCTZ 25 mg daily, losartan 50 mg daily, Toprol-XL 25 mg daily with as needed refills   Time Spent: 20 minutes total time of encounter, including 15 minutes spent in face-to-face patient care on the date of this encounter. This time includes  coordination of care and counseling regarding above mentioned problem list. Remainder of non-face-to-face time involved reviewing chart documents/testing relevant to the patient encounter and documentation in the medical record. I have independently reviewed documentation from referring provider  Medication Adjustments/Labs and Tests Ordered: Current medicines are reviewed at length with the patient today.  Concerns regarding medicines are outlined above.  No orders of the defined types were placed in this encounter.  No orders of the defined types were placed in this encounter.  Follow-up with me in 1 year  Signed, Wilbert Bihari, MD  03/10/2024 1:35 PM    [facility]     [1]  Current Meds  Medication Sig   albuterol (VENTOLIN HFA) 108 (90 Base) MCG/ACT inhaler SMARTSIG:2 Puff(s) By Mouth Every 4-6 Hours PRN   atorvastatin  (LIPITOR) 10 MG tablet Take 1 tablet (10 mg total) by mouth daily.   Calcium  Carbonate-Vitamin D (CALTRATE 600+D PO) Take 1 tablet by mouth 2 (two) times daily.    cetirizine (ZYRTEC) 10 MG tablet Take 10 mg by mouth daily. Pt takes generic form of Zyrtec   Cholecalciferol (VITAMIN D3) 1000 UNITS CAPS Take 2,000 mg by mouth daily.   Cyanocobalamin (VITAMIN B12 PO) Take by mouth.   FLUoxetine (PROZAC) 10 MG capsule Take 10 mg by mouth daily.   furosemide  (LASIX ) 20 MG tablet Take 1 tablet (20 mg total) by mouth daily as needed.   hydrochlorothiazide  (HYDRODIURIL ) 25 MG tablet Take 25 mg by mouth daily.   hydrocortisone  (ANUSOL -HC) 2.5 % rectal cream Place 1 application rectally 2 (two) times daily. (Patient taking differently: Place 1 application  rectally as needed.)   lansoprazole  (  PREVACID ) 30 MG capsule Take 1 capsule (30 mg total) by mouth daily.   levothyroxine (SYNTHROID) 75 MCG tablet Take 75 mcg by mouth daily.   losartan (COZAAR) 25 MG tablet Take 50 mg by mouth daily.    metoprolol succinate (TOPROL-XL) 25 MG 24 hr tablet Take 1  tablet by mouth once daily   Multiple Vitamin (MULITIVITAMIN WITH MINERALS) TABS Take 1 tablet by mouth every evening.    nystatin cream (MYCOSTATIN) nystatin 100,000 unit/gram topical cream  APPLY CREAM TOPICALLY TO AFFECTED AREA TWICE DAILY   Omega-3 Fatty Acids (FISH OIL) 1000 MG CAPS Take 1 capsule by mouth 3 (three) times daily.    pantoprazole  (PROTONIX ) 40 MG tablet Take 1 tablet by mouth once daily   RSV vaccine recomb adjuvanted (AREXVY ) 120 MCG/0.5ML injection Inject into the muscle.   Semaglutide , 2 MG/DOSE, (OZEMPIC , 2 MG/DOSE,) 8 MG/3ML SOPN Inject 2 mg into the skin once a week.   timolol (TIMOPTIC) 0.5 % ophthalmic solution Place 1 drop into both eyes daily.   "

## 2024-03-13 ENCOUNTER — Telehealth: Payer: Self-pay | Admitting: *Deleted

## 2024-03-13 DIAGNOSIS — G4733 Obstructive sleep apnea (adult) (pediatric): Secondary | ICD-10-CM

## 2024-03-13 NOTE — Telephone Encounter (Signed)
-----   Message from Wilbert Bihari, MD sent at 03/10/2024  1:52 PM EST ----- Please get an OV with the DME for mask fitting - her mask is really leaking>> then also needs Rx sent to get cushions every 4 weeks

## 2024-03-13 NOTE — Telephone Encounter (Signed)
 DME=[facility]   Order placed to [facility] via community message.   Patient was encouraged to call her insurance to see if authorization is needed to get her cushions every 4 weeks and that the order has been placed in the system for the dme.

## 2024-06-06 ENCOUNTER — Ambulatory Visit: Admitting: Cardiology
